# Patient Record
Sex: Male | Born: 1970 | Race: White | Hispanic: No | Marital: Married | Smoking: Never smoker
Health system: Southern US, Community
[De-identification: ages and names within clinical notes are randomized; demographics above are authoritative.]

## PROBLEM LIST (undated history)

## (undated) DIAGNOSIS — J45909 Unspecified asthma, uncomplicated: Secondary | ICD-10-CM

## (undated) DIAGNOSIS — F419 Anxiety disorder, unspecified: Secondary | ICD-10-CM

## (undated) DIAGNOSIS — T7840XA Allergy, unspecified, initial encounter: Secondary | ICD-10-CM

## (undated) HISTORY — DX: Allergy, unspecified, initial encounter: T78.40XA

## (undated) HISTORY — PX: CYST EXCISION: SHX5701

## (undated) HISTORY — DX: Unspecified asthma, uncomplicated: J45.909

---

## 2005-02-11 ENCOUNTER — Emergency Department (HOSPITAL_COMMUNITY): Admission: EM | Admit: 2005-02-11 | Discharge: 2005-02-11 | Payer: Self-pay | Admitting: Internal Medicine

## 2005-02-13 ENCOUNTER — Ambulatory Visit: Payer: Self-pay | Admitting: Internal Medicine

## 2005-02-21 ENCOUNTER — Ambulatory Visit: Payer: Self-pay | Admitting: Internal Medicine

## 2005-09-04 ENCOUNTER — Ambulatory Visit: Payer: Self-pay | Admitting: Internal Medicine

## 2009-01-18 ENCOUNTER — Emergency Department (HOSPITAL_COMMUNITY): Admission: EM | Admit: 2009-01-18 | Discharge: 2009-01-18 | Payer: Self-pay | Admitting: Emergency Medicine

## 2009-01-24 ENCOUNTER — Encounter: Admission: RE | Admit: 2009-01-24 | Discharge: 2009-01-24 | Payer: Self-pay | Admitting: Orthopaedic Surgery

## 2016-05-31 DIAGNOSIS — T63451D Toxic effect of venom of hornets, accidental (unintentional), subsequent encounter: Secondary | ICD-10-CM | POA: Diagnosis not present

## 2016-06-07 DIAGNOSIS — J018 Other acute sinusitis: Secondary | ICD-10-CM | POA: Diagnosis not present

## 2016-06-07 DIAGNOSIS — R05 Cough: Secondary | ICD-10-CM | POA: Diagnosis not present

## 2016-06-27 DIAGNOSIS — J40 Bronchitis, not specified as acute or chronic: Secondary | ICD-10-CM | POA: Diagnosis not present

## 2016-07-05 DIAGNOSIS — T63451D Toxic effect of venom of hornets, accidental (unintentional), subsequent encounter: Secondary | ICD-10-CM | POA: Diagnosis not present

## 2016-08-02 DIAGNOSIS — T63451D Toxic effect of venom of hornets, accidental (unintentional), subsequent encounter: Secondary | ICD-10-CM | POA: Diagnosis not present

## 2016-08-28 ENCOUNTER — Ambulatory Visit (INDEPENDENT_AMBULATORY_CARE_PROVIDER_SITE_OTHER): Payer: 59 | Admitting: Family Medicine

## 2016-08-28 ENCOUNTER — Encounter: Payer: Self-pay | Admitting: Family Medicine

## 2016-08-28 VITALS — BP 130/87 | HR 60 | Temp 98.0°F | Ht 71.0 in | Wt 277.8 lb

## 2016-08-28 DIAGNOSIS — Z0000 Encounter for general adult medical examination without abnormal findings: Secondary | ICD-10-CM

## 2016-08-28 NOTE — Progress Notes (Signed)
   HPI  Patient presents today here for an annual physical exam.  Patient states he had difficulty losing weight over the last few years. Otherwise he has no complaints.  He has been watching his diet a little bit better lately, he is not as active as he would like to be.  He previously played soccer with his son who has grownup some and has not been playing as much.  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  PMH: No significant medical history Past surgical history - none Family history: Mother with COPD, hypertension, MGF and PGF with heart disease Social history: Occasional alcohol use and works as an Chief Financial Officer ROS: Per HPI  Objective: BP 130/87   Pulse 60   Temp 98 F (36.7 C) (Oral)   Ht _0  (1.803 m)   Wt 277 lb 12.8 oz (126 kg)   BMI 38.75 kg/m  Gen: NAD, alert, cooperative with exam HEENT: NCAT, EOMI, PERRL, oropharynx clear, nares clear, TMs normal bilaterally CV: RRR, good S1/S2, no murmur Resp: CTABL, no wheezes, non-labored Abd: SNTND, BS present, no guarding or organomegaly Ext: No edema, warm Neuro: Alert and oriented, strength 5/5 in bilateral lower extremities  Assessment and plan:  # Annual physical exam Decided to wait normal exam Discussed therapeutic lifestyle changes. Patient is motivated. Basic labs fasting arranged. Follow-up one year unless needed center     Orders Placed This Encounter  Procedures  . CMP14+EGFR    Standing Status:   Future    Standing Expiration Date:   08/28/2017  . CBC with Differential/Platelet    Standing Status:   Future    Standing Expiration Date:   08/28/2017  . Lipid panel    Standing Status:   Future    Standing Expiration Date:   08/28/2017  . PSA    Standing Status:   Future    Standing Expiration Date:   08/28/2017  . TSH    Standing Status:   Future    Standing Expiration Date:   08/28/2017    Meds ordered this encounter  Medications  . mometasone (NASONEX) 50 MCG/ACT nasal spray   Sig: Place 2 sprays into the nose daily.  Marland Kitchen FLUTICASONE FUROATE IN    Sig: Inhale into the lungs.    Laroy Apple, MD [facility] 08/28/2016, 2:35 PM

## 2016-08-28 NOTE — Patient Instructions (Signed)
Great to meet you!  Lets have you come in for a lab appointment within 2 weeks for fasting labs.   We will plan on seeing you in 1 year unless you need [place] sooner.    Health Maintenance, Male A healthy lifestyle and preventive care is important for your health and wellness. Ask your health care provider about what schedule of regular examinations is right for you. What should I know about weight and diet?  Eat a Healthy Diet  Eat plenty of vegetables, fruits, whole grains, low-fat dairy products, and lean protein.  Do not eat a lot of foods high in solid fats, added sugars, or salt. Maintain a Healthy Weight  Regular exercise can help you achieve or maintain a healthy weight. You should:  Do at least 150 minutes of exercise each week. The exercise should increase your heart rate and make you sweat (moderate-intensity exercise).  Do strength-training exercises at least twice a week. Watch Your Levels of Cholesterol and Blood Lipids  Have your blood tested for lipids and cholesterol every 5 years starting at 46 years of age. If you are at high risk for heart disease, you should start having your blood tested when you are 46 years old. You may need to have your cholesterol levels checked more often if:  Your lipid or cholesterol levels are high.  You are older than 46 years of age.  You are at high risk for heart disease. What should I know about cancer screening? Many types of cancers can be detected early and may often be prevented. Lung Cancer  You should be screened every year for lung cancer if:  You are a current smoker who has smoked for at least 30 years.  You are a former smoker who has quit within the past 15 years.  Talk to your health care provider about your screening options, when you should start screening, and how often you should be screened. Colorectal Cancer  Routine colorectal cancer screening usually begins at 46 years of age and should be repeated every  5-10 years until you are 46 years old. You may need to be screened more often if early forms of precancerous polyps or small growths are found. Your health care provider may recommend screening at an earlier age if you have risk factors for colon cancer.  Your health care provider may recommend using home test kits to check for hidden blood in the stool.  A small camera at the end of a tube can be used to examine your colon (sigmoidoscopy or colonoscopy). This checks for the earliest forms of colorectal cancer. Prostate and Testicular Cancer  Depending on your age and overall health, your health care provider may do certain tests to screen for prostate and testicular cancer.  Talk to your health care provider about any symptoms or concerns you have about testicular or prostate cancer. Skin Cancer  Check your skin from head to toe regularly.  Tell your health care provider about any new moles or changes in moles, especially if:  There is a change in a mole's size, shape, or color.  You have a mole that is larger than a pencil eraser.  Always use sunscreen. Apply sunscreen liberally and repeat throughout the day.  Protect yourself by wearing long sleeves, pants, a wide-brimmed hat, and sunglasses when outside. What should I know about heart disease, diabetes, and high blood pressure?  If you are 53-84 years of age, have your blood pressure checked every 3-5 years. If  you are 26 years of age or older, have your blood pressure checked every year. You should have your blood pressure measured twice-once when you are at a hospital or clinic, and once when you are not at a hospital or clinic. Record the average of the two measurements. To check your blood pressure when you are not at a hospital or clinic, you can use:  An automated blood pressure machine at a pharmacy.  A home blood pressure monitor.  Talk to your health care provider about your target blood pressure.  If you are between  76-63 years old, ask your health care provider if you should take aspirin to prevent heart disease.  Have regular diabetes screenings by checking your fasting blood sugar level.  If you are at a normal weight and have a low risk for diabetes, have this test once every three years after the age of 60.  If you are overweight and have a high risk for diabetes, consider being tested at a younger age or more often.  A one-time screening for abdominal aortic aneurysm (AAA) by ultrasound is recommended for men aged 65-75 years who are current or former smokers. What should I know about preventing infection? Hepatitis B  If you have a higher risk for hepatitis B, you should be screened for this virus. Talk with your health care provider to find out if you are at risk for hepatitis B infection. Hepatitis C  Blood testing is recommended for:  Everyone born from [birth year] through 1965.  Anyone with known risk factors for hepatitis C. Sexually Transmitted Diseases (STDs)  You should be screened each year for STDs including gonorrhea and chlamydia if:  You are sexually active and are younger than 46 years of age.  You are older than 46 years of age and your health care provider tells you that you are at risk for this type of infection.  Your sexual activity has changed since you were last screened and you are at an increased risk for chlamydia or gonorrhea. Ask your health care provider if you are at risk.  Talk with your health care provider about whether you are at high risk of being infected with HIV. Your health care provider may recommend a prescription medicine to help prevent HIV infection. What else can I do?  Schedule regular health, dental, and eye exams.  Stay current with your vaccines (immunizations).  Do not use any tobacco products, such as cigarettes, chewing tobacco, and e-cigarettes. If you need help quitting, ask your health care provider.  Limit alcohol intake to no more than 2  drinks per day. One drink equals 12 ounces of beer, 5 ounces of wine, or 1 ounces of hard liquor.  Do not use street drugs.  Do not share needles.  Ask your health care provider for help if you need support or information about quitting drugs.  Tell your health care provider if you often feel depressed.  Tell your health care provider if you have ever been abused or do not feel safe at home. This information is not intended to replace advice given to you by your health care provider. Make sure you discuss any questions you have with your health care provider. Document Released: 11/11/2007 Document Revised: 01/12/2016 Document Reviewed: 02/16/2015 Elsevier Interactive Patient Education  2017 [employer].

## 2016-09-01 ENCOUNTER — Other Ambulatory Visit: Payer: 59

## 2016-09-01 DIAGNOSIS — Z0000 Encounter for general adult medical examination without abnormal findings: Secondary | ICD-10-CM | POA: Diagnosis not present

## 2016-09-01 DIAGNOSIS — T63451D Toxic effect of venom of hornets, accidental (unintentional), subsequent encounter: Secondary | ICD-10-CM | POA: Diagnosis not present

## 2016-09-02 LAB — PSA: PROSTATE SPECIFIC AG, SERUM: 0.3 ng/mL (ref 0.0–4.0)

## 2016-09-02 LAB — CBC WITH DIFFERENTIAL/PLATELET
BASOS: 0 %
Basophils Absolute: 0 10*3/uL (ref 0.0–0.2)
EOS (ABSOLUTE): 0.4 10*3/uL (ref 0.0–0.4)
Eos: 4 %
Hematocrit: 43.5 % (ref 37.5–51.0)
Hemoglobin: 14.4 g/dL (ref 13.0–17.7)
IMMATURE GRANULOCYTES: 0 %
Immature Grans (Abs): 0 10*3/uL (ref 0.0–0.1)
Lymphocytes Absolute: 2.6 10*3/uL (ref 0.7–3.1)
Lymphs: 25 %
MCH: 29.6 pg (ref 26.6–33.0)
MCHC: 33.1 g/dL (ref 31.5–35.7)
MCV: 89 fL (ref 79–97)
MONOS ABS: 0.9 10*3/uL (ref 0.1–0.9)
Monocytes: 8 %
NEUTROS PCT: 63 %
Neutrophils Absolute: 6.6 10*3/uL (ref 1.4–7.0)
PLATELETS: 236 10*3/uL (ref 150–379)
RBC: 4.87 x10E6/uL (ref 4.14–5.80)
RDW: 13.4 % (ref 12.3–15.4)
WBC: 10.5 10*3/uL (ref 3.4–10.8)

## 2016-09-02 LAB — CMP14+EGFR
A/G RATIO: 1.7 (ref 1.2–2.2)
ALK PHOS: 104 IU/L (ref 39–117)
ALT: 16 IU/L (ref 0–44)
AST: 12 IU/L (ref 0–40)
Albumin: 4.5 g/dL (ref 3.5–5.5)
BUN/Creatinine Ratio: 13 (ref 9–20)
BUN: 13 mg/dL (ref 6–24)
Bilirubin Total: 0.4 mg/dL (ref 0.0–1.2)
CO2: 25 mmol/L (ref 18–29)
Calcium: 9.6 mg/dL (ref 8.7–10.2)
Chloride: 101 mmol/L (ref 96–106)
Creatinine, Ser: 1.04 mg/dL (ref 0.76–1.27)
GFR calc Af Amer: 100 mL/min/{1.73_m2} (ref >59)
GFR calc non Af Amer: 86 mL/min/{1.73_m2} (ref >59)
GLOBULIN, TOTAL: 2.7 g/dL (ref 1.5–4.5)
Glucose: 81 mg/dL (ref 65–99)
POTASSIUM: 4.9 mmol/L (ref 3.5–5.2)
SODIUM: 144 mmol/L (ref 134–144)
Total Protein: 7.2 g/dL (ref 6.0–8.5)

## 2016-09-02 LAB — LIPID PANEL
Chol/HDL Ratio: 5.7 ratio — ABNORMAL HIGH (ref 0.0–5.0)
Cholesterol, Total: 193 mg/dL (ref 100–199)
HDL: 34 mg/dL — AB (ref >39)
LDL Calculated: 120 mg/dL — ABNORMAL HIGH (ref 0–99)
Triglycerides: 193 mg/dL — ABNORMAL HIGH (ref 0–149)
VLDL Cholesterol Cal: 39 mg/dL (ref 5–40)

## 2016-09-02 LAB — TSH: TSH: 2.53 u[IU]/mL (ref 0.450–4.500)

## 2016-09-27 DIAGNOSIS — T63441D Toxic effect of venom of bees, accidental (unintentional), subsequent encounter: Secondary | ICD-10-CM | POA: Diagnosis not present

## 2016-11-03 DIAGNOSIS — T63451D Toxic effect of venom of hornets, accidental (unintentional), subsequent encounter: Secondary | ICD-10-CM | POA: Diagnosis not present

## 2016-12-06 DIAGNOSIS — T63451D Toxic effect of venom of hornets, accidental (unintentional), subsequent encounter: Secondary | ICD-10-CM | POA: Diagnosis not present

## 2016-12-27 DIAGNOSIS — T63451D Toxic effect of venom of hornets, accidental (unintentional), subsequent encounter: Secondary | ICD-10-CM | POA: Diagnosis not present

## 2017-01-31 DIAGNOSIS — T63451D Toxic effect of venom of hornets, accidental (unintentional), subsequent encounter: Secondary | ICD-10-CM | POA: Diagnosis not present

## 2017-02-28 DIAGNOSIS — T63451D Toxic effect of venom of hornets, accidental (unintentional), subsequent encounter: Secondary | ICD-10-CM | POA: Diagnosis not present

## 2017-03-29 DIAGNOSIS — J4520 Mild intermittent asthma, uncomplicated: Secondary | ICD-10-CM | POA: Diagnosis not present

## 2017-03-29 DIAGNOSIS — T63451D Toxic effect of venom of hornets, accidental (unintentional), subsequent encounter: Secondary | ICD-10-CM | POA: Diagnosis not present

## 2017-03-29 DIAGNOSIS — T63441D Toxic effect of venom of bees, accidental (unintentional), subsequent encounter: Secondary | ICD-10-CM | POA: Diagnosis not present

## 2017-04-05 DIAGNOSIS — T63451D Toxic effect of venom of hornets, accidental (unintentional), subsequent encounter: Secondary | ICD-10-CM | POA: Diagnosis not present

## 2017-05-02 DIAGNOSIS — T63451D Toxic effect of venom of hornets, accidental (unintentional), subsequent encounter: Secondary | ICD-10-CM | POA: Diagnosis not present

## 2017-05-12 DIAGNOSIS — M25511 Pain in right shoulder: Secondary | ICD-10-CM | POA: Diagnosis not present

## 2017-05-12 DIAGNOSIS — G8929 Other chronic pain: Secondary | ICD-10-CM | POA: Diagnosis not present

## 2017-05-30 DIAGNOSIS — T63451D Toxic effect of venom of hornets, accidental (unintentional), subsequent encounter: Secondary | ICD-10-CM | POA: Diagnosis not present

## 2017-06-24 DIAGNOSIS — R05 Cough: Secondary | ICD-10-CM | POA: Diagnosis not present

## 2017-06-24 DIAGNOSIS — J329 Chronic sinusitis, unspecified: Secondary | ICD-10-CM | POA: Diagnosis not present

## 2017-07-04 DIAGNOSIS — T63451D Toxic effect of venom of hornets, accidental (unintentional), subsequent encounter: Secondary | ICD-10-CM | POA: Diagnosis not present

## 2017-08-07 DIAGNOSIS — T63451D Toxic effect of venom of hornets, accidental (unintentional), subsequent encounter: Secondary | ICD-10-CM | POA: Diagnosis not present

## 2017-08-30 DIAGNOSIS — T63451D Toxic effect of venom of hornets, accidental (unintentional), subsequent encounter: Secondary | ICD-10-CM | POA: Diagnosis not present

## 2017-09-27 DIAGNOSIS — T63451D Toxic effect of venom of hornets, accidental (unintentional), subsequent encounter: Secondary | ICD-10-CM | POA: Diagnosis not present

## 2017-09-27 DIAGNOSIS — J4520 Mild intermittent asthma, uncomplicated: Secondary | ICD-10-CM | POA: Diagnosis not present

## 2017-09-27 DIAGNOSIS — T63441D Toxic effect of venom of bees, accidental (unintentional), subsequent encounter: Secondary | ICD-10-CM | POA: Diagnosis not present

## 2017-10-31 DIAGNOSIS — J3089 Other allergic rhinitis: Secondary | ICD-10-CM | POA: Diagnosis not present

## 2017-11-14 DIAGNOSIS — M25511 Pain in right shoulder: Secondary | ICD-10-CM | POA: Insufficient documentation

## 2017-11-27 DIAGNOSIS — M25511 Pain in right shoulder: Secondary | ICD-10-CM | POA: Diagnosis not present

## 2017-12-03 DIAGNOSIS — M25511 Pain in right shoulder: Secondary | ICD-10-CM | POA: Diagnosis not present

## 2017-12-12 DIAGNOSIS — T63461D Toxic effect of venom of wasps, accidental (unintentional), subsequent encounter: Secondary | ICD-10-CM | POA: Diagnosis not present

## 2017-12-12 DIAGNOSIS — T63451D Toxic effect of venom of hornets, accidental (unintentional), subsequent encounter: Secondary | ICD-10-CM | POA: Diagnosis not present

## 2017-12-12 DIAGNOSIS — T63441D Toxic effect of venom of bees, accidental (unintentional), subsequent encounter: Secondary | ICD-10-CM | POA: Diagnosis not present

## 2018-02-08 DIAGNOSIS — M25511 Pain in right shoulder: Secondary | ICD-10-CM | POA: Diagnosis not present

## 2018-02-11 ENCOUNTER — Other Ambulatory Visit: Payer: Self-pay | Admitting: Orthopedic Surgery

## 2018-02-11 DIAGNOSIS — M25511 Pain in right shoulder: Secondary | ICD-10-CM

## 2018-02-19 ENCOUNTER — Ambulatory Visit
Admission: RE | Admit: 2018-02-19 | Discharge: 2018-02-19 | Disposition: A | Discharge status: Home / Self Care | Payer: 59 | Source: Ambulatory Visit | Attending: Orthopedic Surgery | Admitting: Orthopedic Surgery

## 2018-02-19 DIAGNOSIS — M19011 Primary osteoarthritis, right shoulder: Secondary | ICD-10-CM | POA: Diagnosis not present

## 2018-02-19 DIAGNOSIS — M25511 Pain in right shoulder: Secondary | ICD-10-CM

## 2018-03-06 DIAGNOSIS — M25511 Pain in right shoulder: Secondary | ICD-10-CM | POA: Diagnosis not present

## 2018-03-20 DIAGNOSIS — T63451D Toxic effect of venom of hornets, accidental (unintentional), subsequent encounter: Secondary | ICD-10-CM | POA: Diagnosis not present

## 2018-03-20 DIAGNOSIS — T63461D Toxic effect of venom of wasps, accidental (unintentional), subsequent encounter: Secondary | ICD-10-CM | POA: Diagnosis not present

## 2018-03-20 DIAGNOSIS — T63441D Toxic effect of venom of bees, accidental (unintentional), subsequent encounter: Secondary | ICD-10-CM | POA: Diagnosis not present

## 2018-03-20 DIAGNOSIS — Z23 Encounter for immunization: Secondary | ICD-10-CM | POA: Diagnosis not present

## 2018-03-27 DIAGNOSIS — T63451D Toxic effect of venom of hornets, accidental (unintentional), subsequent encounter: Secondary | ICD-10-CM | POA: Diagnosis not present

## 2018-04-04 DIAGNOSIS — T63451D Toxic effect of venom of hornets, accidental (unintentional), subsequent encounter: Secondary | ICD-10-CM | POA: Diagnosis not present

## 2018-04-10 DIAGNOSIS — T63451D Toxic effect of venom of hornets, accidental (unintentional), subsequent encounter: Secondary | ICD-10-CM | POA: Diagnosis not present

## 2018-04-10 DIAGNOSIS — J4530 Mild persistent asthma, uncomplicated: Secondary | ICD-10-CM | POA: Diagnosis not present

## 2018-04-10 DIAGNOSIS — T63441D Toxic effect of venom of bees, accidental (unintentional), subsequent encounter: Secondary | ICD-10-CM | POA: Diagnosis not present

## 2018-04-10 DIAGNOSIS — T63461D Toxic effect of venom of wasps, accidental (unintentional), subsequent encounter: Secondary | ICD-10-CM | POA: Diagnosis not present

## 2018-04-10 NOTE — Pre-Procedure Instructions (Signed)
Samuel Duran  04/10/2018      [id number] - [place], [place] - [address] [place] [id number] Phone: [phone] Fax: [phone]    Your procedure is scheduled on Nov. 21  [facility]:30  A.M.  Call this number if you have problems the morning of surgery:  [phone]   Remember:  Do not eat or drink after midnight.      Take these medicines the morning of surgery with A SIP OF WATER:              flonase nasal spray             levalbuterol (xopenex) inhaler--bring to hospital             Loratadine ( claritin)             Eye drops if needed            7 days prior to surgery STOP taking any Aspirin(unless otherwise instructed by your surgeon), Aleve, Naproxen, Ibuprofen, Motrin, Advil, Goody's, BC's, all herbal medications, fish oil, and all vitamins    Do not wear jewelry.  Do not wear lotions, powders, or perfumes, or deodorant.  Do not shave 48 hours prior to surgery.  Men may shave face and neck.  Do not bring valuables to the hospital.  [facility] is not responsible for any belongings or valuables.  Contacts, dentures or bridgework may not be worn into surgery.  Leave your suitcase in the car.  After surgery it may be brought to your room.  For patients admitted to the hospital, discharge time will be determined by your treatment team.  Patients discharged the day of surgery will not be allowed to drive home.    Special instructions:  [place]- Preparing For Surgery  Before surgery, you can play an important role. Because skin is not sterile, your skin needs to be as free of germs as possible. You can reduce the number of germs on your skin by washing with CHG (chlorahexidine gluconate) Soap before surgery.  CHG is an antiseptic cleaner which kills germs and bonds with the skin to continue killing germs even after washing.    Oral Hygiene is also important to reduce  your risk of infection.  Remember - BRUSH YOUR TEETH THE MORNING OF SURGERY WITH YOUR REGULAR TOOTHPASTE  Please do not use if you have an allergy to CHG or antibacterial soaps. If your skin becomes reddened/irritated stop using the CHG.  Do not shave (including legs and underarms) for at least 48 hours prior to first CHG shower. It is OK to shave your face.  Please follow these instructions carefully.   1. Shower the NIGHT BEFORE SURGERY and the MORNING OF SURGERY with CHG.   2. If you chose to wash your hair, wash your hair first as usual with your normal shampoo.  3. After you shampoo, rinse your hair and body thoroughly to remove the shampoo.  4. Use CHG as you would any other liquid soap. You can apply CHG directly to the skin and wash gently with a scrungie or a clean washcloth.   5. Apply the CHG Soap to your body ONLY FROM THE NECK DOWN.  Do not use on open wounds or open sores. Avoid contact with your eyes, ears, mouth and genitals (private parts). Wash Face and genitals (private parts)  with your normal soap.  6. Wash  thoroughly, paying special attention to the area where your surgery will be performed.  7. Thoroughly rinse your body with warm water from the neck down.  8. DO NOT shower/wash with your normal soap after using and rinsing off the CHG Soap.  9. Pat yourself dry with a CLEAN TOWEL.  10. Wear CLEAN PAJAMAS to bed the night before surgery, wear comfortable clothes the morning of surgery  11. Place CLEAN SHEETS on your bed the night of your first shower and DO NOT SLEEP WITH PETS.    Day of Surgery:  Do not apply any deodorants/lotions.  Please wear clean clothes to the hospital/surgery center.   Remember to brush your teeth WITH YOUR REGULAR TOOTHPASTE.    Please read over the following fact sheets that you were given. Coughing and Deep Breathing, MRSA Information and Surgical Site Infection Prevention

## 2018-04-11 ENCOUNTER — Encounter (HOSPITAL_COMMUNITY)
Admission: RE | Admit: 2018-04-11 | Discharge: 2018-04-11 | Disposition: A | Discharge status: Home / Self Care | Payer: 59 | Source: Ambulatory Visit | Attending: Orthopedic Surgery | Admitting: Orthopedic Surgery

## 2018-04-11 ENCOUNTER — Encounter (HOSPITAL_COMMUNITY): Payer: Self-pay

## 2018-04-11 DIAGNOSIS — Z01812 Encounter for preprocedural laboratory examination: Secondary | ICD-10-CM | POA: Diagnosis present

## 2018-04-11 HISTORY — DX: Anxiety disorder, unspecified: F41.9

## 2018-04-11 LAB — SURGICAL PCR SCREEN
MRSA, PCR: NEGATIVE
STAPHYLOCOCCUS AUREUS: NEGATIVE

## 2018-04-11 LAB — CBC
HEMATOCRIT: 43.1 % (ref 39.0–52.0)
HEMOGLOBIN: 13.7 g/dL (ref 13.0–17.0)
MCH: 28 pg (ref 26.0–34.0)
MCHC: 31.8 g/dL (ref 30.0–36.0)
MCV: 88.1 fL (ref 80.0–100.0)
Platelets: 359 10*3/uL (ref 150–400)
RBC: 4.89 MIL/uL (ref 4.22–5.81)
RDW: 13.3 % (ref 11.5–15.5)
WBC: 8.9 10*3/uL (ref 4.0–10.5)
nRBC: 0 % (ref 0.0–0.2)

## 2018-04-11 NOTE — Progress Notes (Signed)
PCP: Dr[facility] Ermalinda MemosBradshaw [facility]

## 2018-04-17 DIAGNOSIS — T63441D Toxic effect of venom of bees, accidental (unintentional), subsequent encounter: Secondary | ICD-10-CM | POA: Diagnosis not present

## 2018-04-17 DIAGNOSIS — T63451D Toxic effect of venom of hornets, accidental (unintentional), subsequent encounter: Secondary | ICD-10-CM | POA: Diagnosis not present

## 2018-04-17 DIAGNOSIS — T63461D Toxic effect of venom of wasps, accidental (unintentional), subsequent encounter: Secondary | ICD-10-CM | POA: Diagnosis not present

## 2018-04-17 MED ORDER — DEXTROSE 5 % IV SOLN
3.0000 g | INTRAVENOUS | Status: AC
  Administered 2018-04-18: 3 g via INTRAVENOUS
  Filled 2018-04-17: qty 3

## 2018-04-17 MED ORDER — TRANEXAMIC ACID-NACL 1000-0.7 MG/100ML-% IV SOLN
1000.0000 mg | INTRAVENOUS | Status: AC
  Administered 2018-04-18: 1000 mg via INTRAVENOUS
  Filled 2018-04-17 (×2): qty 100

## 2018-04-18 ENCOUNTER — Other Ambulatory Visit: Payer: Self-pay

## 2018-04-18 ENCOUNTER — Ambulatory Visit (HOSPITAL_COMMUNITY): Payer: 59 | Admitting: Anesthesiology

## 2018-04-18 ENCOUNTER — Encounter (HOSPITAL_COMMUNITY)
Admission: RE | Disposition: A | Discharge status: Home / Self Care | Payer: Self-pay | Source: Ambulatory Visit | Attending: Orthopedic Surgery

## 2018-04-18 ENCOUNTER — Encounter (HOSPITAL_COMMUNITY): Payer: Self-pay | Admitting: Surgery

## 2018-04-18 ENCOUNTER — Ambulatory Visit (HOSPITAL_COMMUNITY)
Admission: RE | Admit: 2018-04-18 | Discharge: 2018-04-18 | Disposition: A | Discharge status: Home / Self Care | Payer: 59 | Source: Ambulatory Visit | Attending: Orthopedic Surgery | Admitting: Orthopedic Surgery

## 2018-04-18 DIAGNOSIS — M25511 Pain in right shoulder: Secondary | ICD-10-CM | POA: Diagnosis not present

## 2018-04-18 DIAGNOSIS — M19011 Primary osteoarthritis, right shoulder: Secondary | ICD-10-CM | POA: Insufficient documentation

## 2018-04-18 DIAGNOSIS — R600 Localized edema: Secondary | ICD-10-CM | POA: Diagnosis not present

## 2018-04-18 DIAGNOSIS — G8918 Other acute postprocedural pain: Secondary | ICD-10-CM | POA: Diagnosis not present

## 2018-04-18 HISTORY — PX: TOTAL SHOULDER ARTHROPLASTY: SHX126

## 2018-04-18 SURGERY — ARTHROPLASTY, SHOULDER, TOTAL
Anesthesia: General | Laterality: Right

## 2018-04-18 MED ORDER — ONDANSETRON HCL 4 MG PO TABS
4.0000 mg | ORAL_TABLET | Freq: Once | ORAL | Status: DC
  Filled 2018-04-18 (×2): qty 1

## 2018-04-18 MED ORDER — EPHEDRINE SULFATE-NACL 50-0.9 MG/10ML-% IV SOSY
PREFILLED_SYRINGE | INTRAVENOUS | Status: DC | PRN
  Administered 2018-04-18 (×2): 5 mg via INTRAVENOUS

## 2018-04-18 MED ORDER — EPHEDRINE 5 MG/ML INJ
INTRAVENOUS | Status: AC
  Filled 2018-04-18: qty 10

## 2018-04-18 MED ORDER — BUPIVACAINE LIPOSOME 1.3 % IJ SUSP
INTRAMUSCULAR | Status: DC | PRN
  Administered 2018-04-18: 10 mL via PERINEURAL

## 2018-04-18 MED ORDER — DEXAMETHASONE SODIUM PHOSPHATE 10 MG/ML IJ SOLN
INTRAMUSCULAR | Status: AC
  Filled 2018-04-18: qty 1

## 2018-04-18 MED ORDER — SUGAMMADEX SODIUM 200 MG/2ML IV SOLN
INTRAVENOUS | Status: DC | PRN
  Administered 2018-04-18: 200 mg via INTRAVENOUS

## 2018-04-18 MED ORDER — OXYCODONE HCL 5 MG/5ML PO SOLN: 5.0000 mg | Freq: Once | ORAL | Status: DC | PRN

## 2018-04-18 MED ORDER — ROCURONIUM BROMIDE 50 MG/5ML IV SOSY
PREFILLED_SYRINGE | INTRAVENOUS | Status: DC | PRN
  Administered 2018-04-18: 50 mg via INTRAVENOUS

## 2018-04-18 MED ORDER — PROMETHAZINE HCL 25 MG/ML IJ SOLN: 6.2500 mg | INTRAMUSCULAR | Status: DC | PRN

## 2018-04-18 MED ORDER — SODIUM CHLORIDE 0.9 % IV SOLN
INTRAVENOUS | Status: DC | PRN
  Administered 2018-04-18: 20 ug/min via INTRAVENOUS

## 2018-04-18 MED ORDER — OXYCODONE HCL 5 MG PO TABS: 5.0000 mg | ORAL_TABLET | Freq: Once | ORAL | Status: DC | PRN

## 2018-04-18 MED ORDER — LACTATED RINGERS IV SOLN
INTRAVENOUS | Status: DC | PRN
  Administered 2018-04-18: 07:00:00 via INTRAVENOUS

## 2018-04-18 MED ORDER — 0.9 % SODIUM CHLORIDE (POUR BTL) OPTIME
TOPICAL | Status: DC | PRN
  Administered 2018-04-18: 1000 mL

## 2018-04-18 MED ORDER — LIDOCAINE 2% (20 MG/ML) 5 ML SYRINGE
INTRAMUSCULAR | Status: AC
  Filled 2018-04-18: qty 5

## 2018-04-18 MED ORDER — HYDROMORPHONE HCL 1 MG/ML IJ SOLN: 0.2500 mg | INTRAMUSCULAR | Status: DC | PRN

## 2018-04-18 MED ORDER — MEPERIDINE HCL 50 MG/ML IJ SOLN: 6.2500 mg | INTRAMUSCULAR | Status: DC | PRN

## 2018-04-18 MED ORDER — NAPROXEN 500 MG PO TABS: 500.0000 mg | ORAL_TABLET | Freq: Two times a day (BID) | ORAL | 1 refills | Status: DC

## 2018-04-18 MED ORDER — PROPOFOL 10 MG/ML IV BOLUS
INTRAVENOUS | Status: DC | PRN
  Administered 2018-04-18: 200 mg via INTRAVENOUS

## 2018-04-18 MED ORDER — PROPOFOL 10 MG/ML IV BOLUS
INTRAVENOUS | Status: AC
  Filled 2018-04-18: qty 20

## 2018-04-18 MED ORDER — LIDOCAINE 2% (20 MG/ML) 5 ML SYRINGE
INTRAMUSCULAR | Status: DC | PRN
  Administered 2018-04-18: 60 mg via INTRAVENOUS

## 2018-04-18 MED ORDER — OXYCODONE-ACETAMINOPHEN 5-325 MG PO TABS: 1.0000 | ORAL_TABLET | ORAL | 0 refills | Status: DC | PRN

## 2018-04-18 MED ORDER — ONDANSETRON HCL 4 MG/2ML IJ SOLN
INTRAMUSCULAR | Status: AC
  Filled 2018-04-18: qty 2

## 2018-04-18 MED ORDER — SUGAMMADEX SODIUM 200 MG/2ML IV SOLN
INTRAVENOUS | Status: AC
  Filled 2018-04-18: qty 2

## 2018-04-18 MED ORDER — CHLORHEXIDINE GLUCONATE 4 % EX LIQD: 60.0000 mL | Freq: Once | CUTANEOUS | Status: DC

## 2018-04-18 MED ORDER — CYCLOBENZAPRINE HCL 10 MG PO TABS: 10.0000 mg | ORAL_TABLET | Freq: Three times a day (TID) | ORAL | 1 refills | Status: DC | PRN

## 2018-04-18 MED ORDER — ROCURONIUM BROMIDE 50 MG/5ML IV SOSY
PREFILLED_SYRINGE | INTRAVENOUS | Status: AC
  Filled 2018-04-18: qty 5

## 2018-04-18 MED ORDER — FENTANYL CITRATE (PF) 250 MCG/5ML IJ SOLN
INTRAMUSCULAR | Status: AC
  Filled 2018-04-18: qty 5

## 2018-04-18 MED ORDER — ONDANSETRON HCL 4 MG PO TABS: 4.0000 mg | ORAL_TABLET | Freq: Three times a day (TID) | ORAL | 0 refills | Status: DC | PRN

## 2018-04-18 MED ORDER — BUPIVACAINE HCL (PF) 0.5 % IJ SOLN
INTRAMUSCULAR | Status: DC | PRN
  Administered 2018-04-18: 20 mL via PERINEURAL

## 2018-04-18 MED ORDER — ONDANSETRON HCL 4 MG/2ML IJ SOLN
INTRAMUSCULAR | Status: DC | PRN
  Administered 2018-04-18: 4 mg via INTRAVENOUS

## 2018-04-18 MED ORDER — FENTANYL CITRATE (PF) 100 MCG/2ML IJ SOLN
INTRAMUSCULAR | Status: DC | PRN
  Administered 2018-04-18 (×2): 50 ug via INTRAVENOUS

## 2018-04-18 MED ORDER — MIDAZOLAM HCL 5 MG/5ML IJ SOLN
INTRAMUSCULAR | Status: DC | PRN
  Administered 2018-04-18: 2 mg via INTRAVENOUS

## 2018-04-18 MED ORDER — MIDAZOLAM HCL 2 MG/2ML IJ SOLN
INTRAMUSCULAR | Status: AC
  Filled 2018-04-18: qty 2

## 2018-04-18 SURGICAL SUPPLY — 69 items
ADH SKN CLS APL DERMABOND .7 (GAUZE/BANDAGES/DRESSINGS) ×1
ADH SKN CLS LQ APL DERMABOND (GAUZE/BANDAGES/DRESSINGS) ×1
AID PSTN UNV HD RSTRNT DISP (MISCELLANEOUS) ×1
BIT DRILL 5/64X5 DISP (BIT) ×3 IMPLANT
BLADE SAW SGTL 83.5X18.5 (BLADE) ×3 IMPLANT
CALIBRATOR GLENOID VIP 5-D (SYSTAGENIX WOUND MANAGEMENT) ×2 IMPLANT
CEMENT BONE DEPUY (Cement) ×3 IMPLANT
COVER SURGICAL LIGHT HANDLE (MISCELLANEOUS) ×3 IMPLANT
COVER WAND RF STERILE (DRAPES) ×3 IMPLANT
DERMABOND ADHESIVE PROPEN (GAUZE/BANDAGES/DRESSINGS) ×2
DERMABOND ADVANCED (GAUZE/BANDAGES/DRESSINGS) ×2
DERMABOND ADVANCED .7 DNX12 (GAUZE/BANDAGES/DRESSINGS) IMPLANT
DERMABOND ADVANCED .7 DNX6 (GAUZE/BANDAGES/DRESSINGS) ×1 IMPLANT
DRAPE ORTHO SPLIT 77X108 STRL (DRAPES) ×6
DRAPE SURG 17X11 SM STRL (DRAPES) ×3 IMPLANT
DRAPE SURG ORHT 6 SPLT 77X108 (DRAPES) ×2 IMPLANT
DRAPE U-SHAPE 47X51 STRL (DRAPES) ×3 IMPLANT
DRSG AQUACEL AG ADV 3.5X10 (GAUZE/BANDAGES/DRESSINGS) ×3 IMPLANT
DURAPREP 26ML APPLICATOR (WOUND CARE) ×3 IMPLANT
ELECT BLADE 4.0 EZ CLEAN MEGAD (MISCELLANEOUS) ×3
ELECT CAUTERY BLADE 6.4 (BLADE) ×3 IMPLANT
ELECT REM PT RETURN 9FT ADLT (ELECTROSURGICAL) ×3
ELECTRODE BLDE 4.0 EZ CLN MEGD (MISCELLANEOUS) ×1 IMPLANT
ELECTRODE REM PT RTRN 9FT ADLT (ELECTROSURGICAL) ×1 IMPLANT
FACESHIELD WRAPAROUND (MASK) ×9 IMPLANT
FACESHIELD WRAPAROUND OR TEAM (MASK) ×3 IMPLANT
GLENOID WITH CLEAT MEDIUM (Shoulder) ×2 IMPLANT
GLOVE BIO SURGEON STRL SZ7.5 (GLOVE) ×3 IMPLANT
GLOVE BIO SURGEON STRL SZ8 (GLOVE) ×3 IMPLANT
GLOVE EUDERMIC 7 POWDERFREE (GLOVE) ×3 IMPLANT
GLOVE SS BIOGEL STRL SZ 7.5 (GLOVE) ×1 IMPLANT
GLOVE SUPERSENSE BIOGEL SZ 7.5 (GLOVE) ×2
GOWN STRL REUS W/ TWL LRG LVL3 (GOWN DISPOSABLE) ×1 IMPLANT
GOWN STRL REUS W/ TWL XL LVL3 (GOWN DISPOSABLE) ×2 IMPLANT
GOWN STRL REUS W/TWL LRG LVL3 (GOWN DISPOSABLE) ×3
GOWN STRL REUS W/TWL XL LVL3 (GOWN DISPOSABLE) ×6
HEAD HUMERAL USP II 50/21 (Shoulder) ×2 IMPLANT
KIT BASIN OR (CUSTOM PROCEDURE TRAY) ×3 IMPLANT
KIT SET UNIVERSAL (KITS) ×2 IMPLANT
KIT TURNOVER KIT B (KITS) ×3 IMPLANT
MANIFOLD NEPTUNE II (INSTRUMENTS) ×3 IMPLANT
NDL TAPERED W/ NITINOL LOOP (MISCELLANEOUS) ×1 IMPLANT
NEEDLE TAPERED W/ NITINOL LOOP (MISCELLANEOUS) ×3 IMPLANT
NS IRRIG 1000ML POUR BTL (IV SOLUTION) ×3 IMPLANT
PACK SHOULDER (CUSTOM PROCEDURE TRAY) ×3 IMPLANT
PAD ARMBOARD 7.5X6 YLW CONV (MISCELLANEOUS) ×6 IMPLANT
RESTRAINT HEAD UNIVERSAL NS (MISCELLANEOUS) ×3 IMPLANT
SLING ARM FOAM STRAP LRG (SOFTGOODS) IMPLANT
SLING ARM IMMOBILIZER LRG (SOFTGOODS) IMPLANT
SLING ARM IMMOBILIZER MED (SOFTGOODS) IMPLANT
SLING ARM XL FOAM STRAP (SOFTGOODS) ×3 IMPLANT
SMARTMIX MINI TOWER (MISCELLANEOUS) ×3
SPONGE LAP 18X18 X RAY DECT (DISPOSABLE) ×3 IMPLANT
SPONGE LAP 4X18 RFD (DISPOSABLE) ×3 IMPLANT
STEM HUMERAL APEX UNI 9MM (Stem) ×2 IMPLANT
SUCTION FRAZIER HANDLE 10FR (MISCELLANEOUS) ×2
SUCTION TUBE FRAZIER 10FR DISP (MISCELLANEOUS) ×1 IMPLANT
SUT FIBERWIRE #2 38 T-5 BLUE (SUTURE) ×3
SUT MNCRL AB 3-0 PS2 18 (SUTURE) ×3 IMPLANT
SUT MON AB 2-0 CT1 36 (SUTURE) ×3 IMPLANT
SUT VIC AB 1 CT1 27 (SUTURE) ×9
SUT VIC AB 1 CT1 27XBRD ANBCTR (SUTURE) ×3 IMPLANT
SUTURE FIBERWR #2 38 T-5 BLUE (SUTURE) ×1 IMPLANT
SUTURE TAPE 1.3 40 TPR END (SUTURE) ×3 IMPLANT
SUTURETAPE 1.3 40 TPR END (SUTURE) ×9
SYR CONTROL 10ML LL (SYRINGE) IMPLANT
TOWEL OR 17X26 10 PK STRL BLUE (TOWEL DISPOSABLE) ×3 IMPLANT
TOWER SMARTMIX MINI (MISCELLANEOUS) ×1 IMPLANT
WATER STERILE IRR 1000ML POUR (IV SOLUTION) ×3 IMPLANT

## 2018-04-18 NOTE — Op Note (Signed)
04/18/2018  10:18 AM  PATIENT:   Samuel Duran  47 y.o. male  PRE-OPERATIVE DIAGNOSIS:  right shoulder osteoarthritis  POST-OPERATIVE DIAGNOSIS: Same  PROCEDURE: Right total shoulder arthroplasty utilizing a press-fit size 9 Arthrex stem with a 50 x 21 eccentric head, and a medium glenoid  SURGEON:  Olman Yono, Vania Rea M.D.  ASSISTANTS: Ralene Bathe, PA-C  ANESTHESIA:   General endotracheal with interscalene block using Exparel  EBL: 250 cc  SPECIMEN: None  Drains: None   PATIENT DISPOSITION:  PACU - hemodynamically stable.    PLAN OF CARE: Admit for overnight observation  Brief history:  Mr. Swiss is a 47 year old gentleman who has had chronic and progressively increasing right shoulder pain related to advanced osteoarthritis with underlying glenoid dysplasia.  His symptoms have been progressive to the point where he is having severe pain and functional limitations and inability to perform a number of day-to-day activities.  His plain radiographs confirm joint deformity with changes consistent with glenoid dysplasia.  A preoperative CT scan was obtained for preoperative planning and did identify appropriate bone stock for an anatomic shoulder replacement.  He is now brought to the operating room for planned anatomic total shoulder arthroplasty  Preoperatively and counseled Mr. Angelica regarding treatment options as well as the potential risks versus benefits thereof.  Possible surgical complications were reviewed including potential bleeding, infection, neurovascular injury, persistent pain, loss of motion, failure the implant, anesthetic complication, and possible need for additional surgery.  He understands and accepts and agrees with her planned procedure.  Procedure detail:  After undergoing routine preop evaluation patient received prophylactic antibiotics and interscalene block with Exparel was established in the holding area by the anesthesia department.  Placed  supine on the operative table underwent smooth induction of a general endotracheal anesthesia.  Placed in the beachchair position and appropriate padding protected.  Right shoulder girdle region was sterilely prepped and draped in standard fashion.  Timeout was called.  Anterior deltopectoral approach to the right shoulder was made through 10 cm incision.  Skin flaps were elevated dissection carried deeply and electrocautery was used for hemostasis.  The deltopectoral interval was then developed from proximal to distal with a vein taken laterally in the upper centimeter and half of the pectoralis major was tenotomized and the chondroitin was mobilized and retracted medially.  The long head biceps tendon was then unroofed tenodesed at the upper border the pectoralis major tendon tenotomized and then unroofed and excised proximally.  We then used electrocautery to outline the margins of the subscapularis insertion into the lesser tuberosity and split the rotator cuff along the rotator interval towards the base of the coracoid and once we identify the dimensions of the lesser tuberosity using oscillating saw to create a lesser tuberosity osteotomy and then mobilized subscapularis and reflected medially and then divided the capsular attachments from the anterior inferior and inferior margins of the humeral neck allowing deliver the humeral head to the wound.  We outlined our proposed humeral head resection with an extra medullary guide and then the head was excised using leg and oscillating saw.  The marginal ossified were then removed with rondure.  The humeral canal was then prepared hand reaming size 7 broaching to size 9 with excellent fit maintaining the native retroversion of approximate 20 degrees.  Size 8 trial stem with metal cap placed in the humeral canal then turned attention to the glenoid.  This was exposed with standard retractors and performed a circumferential labral resection gaining complete  visualization the periphery of the glenoid.  This did show significant dysplasia with preoperative CT scan demonstrating 30 degrees of native retroversion.  Using iris preoperative CT planning we had calibrated our glenoid guide for a 10 degree correction.  The appropriate guide was then placed in a guidepin was directed to the glenoid and then was then reamed with a medium reamer to a stable subchondral bony bed allowing excellent seating of our implant.  The glenoid was then terminally prepared with the central followed by superior and inferior drill and peg respectively implant was then broached in the trial showed excellent fit and good bony support.  At this point on the back table our cement was mixed and introduced into the superior and inferior peg and slot respectively then we took morselized bone and packed this around the central peg of her glenoid the glenoid was then impacted with excellent fit and fixation.  At this time we then returned our attention to the proximal humerus and one medial and to lateral drill holes were then placed through the humeral metaphysis adjacent to the lesser tuberosity osteotomy for repair of the LTO.  Our humeral stem was then threaded with the sutures for our LTL repair the sutures were then shuttled through the bone tunnels on the metaphysis of the humerus the humeral stem was then seated to the proper level with excellent fit and the proximal locking screws were then tightened.  This point we then performed a series of trial reductions and ultimately felt that the size 50 x 21 eccentric head gave us the best soft tissue balance with good stability and easily 50% translation of the humeral head and glenoid.  This point the [facility] taper on the stem was cleaned and dried the final 50 x 21 head was then impacted and the final reduction was performed at the joint was irrigated cleaned and dried.  This point we threaded our medial sutures up through the bone tendon injection of  left tuberosity osteotomy the bone fragment was then manually held in place and the suture limbs were then tied with both parallel and crossing suture limbs all of which allowed excellent re-apposition of the LTO to the humeral metaphysis.  I should mention that we confirmed good mobility of the subscapularis prior to the [facility] repair.  This point the rotator interval was then repaired with a series of nonabsorbable sutures reinforcing the LTL repair.  Once we were completed we are very pleased with the overall soft tissue balance and shoulder easily achieved 30 degrees of X rotation without excessive tension on the LTO.  The wound was then again irrigated hemostasis was obtained.  The deltopectoral interval was reapproximated with a series of figure-of-eight and 1 Vicryl sutures.  2-0 Vicryl used with subcu layer intracuticular 3 Monocryl for the skin followed by Dermabond and Aquasol dressing and then right arm was placed in a sling.  Patient was awakened extubated taken recovery in stable condition  Ralene Batheracy Shuford, PA-C was used as an Geophysicist/field seismologistassistant throughout this case essential for help with positioning the patient position extremity, tissue ablation, implantation of the prosthesis, wound closure, and intraoperative decision-making.  Senaida LangeKevin M Bodi Palmeri MD   Contact # [phone]

## 2018-04-18 NOTE — Anesthesia Postprocedure Evaluation (Signed)
Anesthesia Post Note  Patient: Samuel GlennChristopher R Schloss  Procedure(s) Performed: TOTAL SHOULDER ARTHROPLASTY (Right )     Patient location during evaluation: PACU Anesthesia Type: General Level of consciousness: awake and alert Pain management: pain level controlled Vital Signs Assessment: post-procedure vital signs reviewed and stable Respiratory status: spontaneous breathing, nonlabored ventilation and respiratory function stable Cardiovascular status: blood pressure returned to baseline and stable Postop Assessment: no apparent nausea or vomiting Anesthetic complications: no    Last Vitals:  Vitals:   04/18/18 1105 04/18/18 1120  BP: 117/75 118/80  Pulse: 85 79  Resp: 16 18  Temp:    SpO2: 95% 97%    Last Pain:  Vitals:   04/18/18 1120  PainSc: 0-No pain                 Lowella CurbWarren Ray Joshua Zeringue

## 2018-04-18 NOTE — Discharge Instructions (Signed)
° °Kevin M. Supple, M.D., F.A.A.O.S. °Orthopaedic Surgery °Specializing in Arthroscopic and Reconstructive °Surgery of the Shoulder and Knee °[phone] °[address] - [place], [place] [address] - Fax [phone] ° ° °POST-OP TOTAL SHOULDER REPLACEMENT INSTRUCTIONS ° °1. Call the office at [phone] to schedule your first post-op appointment 10-14 days from the date of your surgery. ° °2. The bandage over your incision is waterproof. You may begin showering with this dressing on. You may leave this dressing on until first follow up appointment within 2 weeks. We prefer you leave this dressing in place until follow up however after 5-7 days if you are having itching or skin irritation and would like to remove it you may do so. Go slow and tug at the borders gently to break the bond the dressing has with the skin. At this point if there is no drainage it is okay to go without a bandage or you may cover it with a light guaze and tape. You can also expect significant bruising around your shoulder that will drift down your arm and into your chest wall. This is very normal and should resolve over several days. ° ° 3. Wear your sling/immobilizer at all times except to perform the exercises below or to occasionally let your arm dangle by your side to stretch your elbow. You also need to sleep in your sling immobilizer until instructed otherwise. ° °4. Range of motion to your elbow, wrist, and hand are encouraged 3-5 times daily. Exercise to your hand and fingers helps to reduce swelling you may experience. ° °5. Utilize ice to the shoulder 3-5 times minimum a day and additionally if you are experiencing pain. ° °6. Prescriptions for a pain medication and a muscle relaxant are provided for you. It is recommended that if you are experiencing pain that you pain medication alone is not controlling, add the muscle relaxant along with the pain medication which can give additional pain relief. The first 1-2 days  is generally the most severe of your pain and then should gradually decrease. As your pain lessens it is recommended that you decrease your use of the pain medications to an "as needed basis'" only and to always comply with the recommended dosages of the pain medications. ° °7. Pain medications can produce constipation along with their use. If you experience this, the use of an over the counter stool softener or laxative daily is recommended.  ° °8. For additional questions or concerns, please do not hesitate to call the office. If after hours there is an answering service to forward your concerns to the physician on call. ° °9.Pain control following an exparel block ° °To help control your post-operative pain you received a nerve block  performed with Exparel which is a long acting anesthetic (numbing agent) which can provide pain relief and sensations of numbness (and relief of pain) in the operative shoulder and arm for up to 3 days. Sometimes it provides mixed relief, meaning you may still have numbness in certain areas of the arm but can still be able to move  parts of that arm, hand, and fingers. We recommend that your prescribed pain medications  be used as needed. We do not feel it is necessary to "pre medicate" and "stay ahead" of pain.  Taking narcotic pain medications when you are not having any pain can lead to unnecessary and potentially dangerous side effects.  ° °POST-OP EXERCISES ° °Pendulum Exercises ° °Perform pendulum exercises while standing and bending at   the waist. Support your uninvolved arm on a table or chair and allow your operated arm to hang freely. Make sure to do these exercises passively - not using you shoulder muscles. ° °Repeat 20 times. Do 3 sessions per day. ° ° ° ° °

## 2018-04-18 NOTE — Anesthesia Procedure Notes (Signed)
Anesthesia Regional Block: Interscalene brachial plexus block   Pre-Anesthetic Checklist: ,, timeout performed, Correct Patient, Correct Site, Correct Laterality, Correct Procedure, Correct Position, site marked, Risks and benefits discussed,  Surgical consent,  Pre-op evaluation,  At surgeon's request and post-op pain management  Laterality: Right  Prep: chloraprep       Needles:  Injection technique: Single-shot  Needle Type: Stimiplex     Needle Length: 9cm  Needle Gauge: 21     Additional Needles:   Procedures:,,,, ultrasound used (permanent image in chart),,,,  Narrative:  Start time: 04/18/2018 7:17 AM End time: 04/18/2018 7:22 AM Injection made incrementally with aspirations every 5 mL.  Performed by: Personally  Anesthesiologist: Lowella CurbMiller, Antonino Nienhuis Ray, MD

## 2018-04-18 NOTE — Transfer of Care (Signed)
Immediate Anesthesia Transfer of Care Note  Patient: Samuel Duran  Procedure(s) Performed: TOTAL SHOULDER ARTHROPLASTY (Right )  Patient Location: PACU  Anesthesia Type:GA combined with regional for post-op pain  Level of Consciousness: awake, alert  and oriented  Airway & Oxygen Therapy: Patient Spontanous Breathing and Patient connected to nasal cannula oxygen  Post-op Assessment: Report given to RN and Post -op Vital signs reviewed and stable  Post vital signs: Reviewed and stable  Last Vitals:  Vitals Value Taken Time  BP 117/75 04/18/2018 11:05 AM  Temp    Pulse 86 04/18/2018 11:05 AM  Resp 15 04/18/2018 11:05 AM  SpO2 94 % 04/18/2018 11:05 AM  Vitals shown include unvalidated device data.  Last Pain:  Vitals:   04/18/18 1050  PainSc: 0-No pain      Patients Stated Pain Goal: 4 (04/18/18 0606)  Complications: No apparent anesthesia complications

## 2018-04-18 NOTE — H&P (Signed)
Samuel Duran    Chief Complaint: right shoulder osteoarthritis HPI: The patient is a 47 y.o. male with end stage right shoulder OA  Past Medical History:  Diagnosis Date  . Allergy   . Anxiety   . Asthma    allergy induced asthma    Past Surgical History:  Procedure Laterality Date  . CYST EXCISION     forehead    Family History  Problem Relation Age of Onset  . COPD Mother   . Hypertension Father   . Heart disease Maternal Grandfather   . Heart disease Paternal Grandfather     Social History:  reports that he has never smoked. He has never used smokeless tobacco. He reports that he drinks alcohol. He reports that he does not use drugs.   Medications Prior to Admission  Medication Sig Dispense Refill  . EPINEPHrine (AUVI-Q) 0.3 mg/0.3 mL IJ SOAJ injection Inject 0.3 mg into the muscle once.    . fluticasone (FLONASE) 50 MCG/ACT nasal spray Place 2 sprays into both nostrils daily.    Marland Kitchen. ibuprofen (ADVIL,MOTRIN) 200 MG tablet Take 600-800 mg by mouth every 6 (six) hours as needed for headache or moderate pain.    Marland Kitchen. levalbuterol (XOPENEX HFA) 45 MCG/ACT inhaler Inhale 1 puff into the lungs every 6 (six) hours as needed for wheezing or shortness of breath.    . loratadine (CLARITIN) 10 MG tablet Take 10 mg by mouth daily.    . Multiple Vitamins-Minerals (MULTIVITAMIN PO) Take 1 tablet by mouth daily.    Marland Kitchen. olopatadine (PATANOL) 0.1 % ophthalmic solution Place 1 drop into both eyes 2 (two) times daily as needed for allergies.    Marland Kitchen. QVAR REDIHALER 40 MCG/ACT inhaler Inhale 1 puff into the lungs 2 (two) times daily.       Physical Exam: right shoulder with painful and restricted motion as noted at recent office visits  Vitals  Temp:  [98.3 F (36.8 C)] 98.3 F (36.8 C) (11/21 0546) Pulse Rate:  [65] 65 (11/21 0546) Resp:  [20] 20 (11/21 0546) BP: (132)/(72) 132/72 (11/21 0546) SpO2:  [99 %] 99 % (11/21 0546)  Assessment/Plan  Impression: right shoulder  osteoarthritis  Plan of Action: Procedure(s): TOTAL SHOULDER ARTHROPLASTY  Samuel Duran 04/18/2018, 6:00 AM Contact # [phone]

## 2018-04-18 NOTE — Anesthesia Preprocedure Evaluation (Signed)
Anesthesia Evaluation  Patient identified by MRN, date of birth, ID band Patient awake    Reviewed: Allergy & Precautions, NPO status , Patient's Chart, lab work & pertinent test results  Airway Mallampati: II  TM Distance: >3 FB Neck ROM: Full    Dental no notable dental hx.    Pulmonary asthma ,    Pulmonary exam normal breath sounds clear to auscultation       Cardiovascular negative cardio ROS Normal cardiovascular exam Rhythm:Regular Rate:Normal     Neuro/Psych Anxiety negative neurological ROS  negative psych ROS   GI/Hepatic negative GI ROS, Neg liver ROS,   Endo/Other  negative endocrine ROS  Renal/GU negative Renal ROS  negative genitourinary   Musculoskeletal negative musculoskeletal ROS (+)   Abdominal (+) + obese,   Peds negative pediatric ROS (+)  Hematology negative hematology ROS (+)   Anesthesia Other Findings   Reproductive/Obstetrics negative OB ROS                             Anesthesia Physical Anesthesia Plan  ASA: II  Anesthesia Plan: General   Post-op Pain Management:  Regional for Post-op pain   Induction: Intravenous  PONV Risk Score and Plan: 2 and Ondansetron and Midazolam  Airway Management Planned: Oral ETT  Additional Equipment:   Intra-op Plan:   Post-operative Plan: Extubation in OR  Informed Consent: I have reviewed the patients History and Physical, chart, labs and discussed the procedure including the risks, benefits and alternatives for the proposed anesthesia with the patient or authorized representative who has indicated his/her understanding and acceptance.   Dental advisory given  Plan Discussed with: CRNA  Anesthesia Plan Comments:         Anesthesia Quick Evaluation

## 2018-04-18 NOTE — Anesthesia Procedure Notes (Signed)
Procedure Name: Intubation Date/Time: 04/18/2018 7:46 AM Performed by: Kyung Rudd, CRNA Pre-anesthesia Checklist: Patient identified, Emergency Drugs available, Suction available and Patient being monitored Patient Re-evaluated:Patient Re-evaluated prior to induction Oxygen Delivery Method: Circle system utilized Preoxygenation: Pre-oxygenation with 100% oxygen Induction Type: IV induction Ventilation: Mask ventilation without difficulty and Oral airway inserted - appropriate to patient size Laryngoscope Size: Mac and 4 Grade View: Grade I Tube type: Oral Tube size: 7.5 mm Number of attempts: 1 Airway Equipment and Method: Stylet Placement Confirmation: ETT inserted through vocal cords under direct vision,  positive ETCO2 and breath sounds checked- equal and bilateral Secured at: 22 cm Tube secured with: Tape Dental Injury: Teeth and Oropharynx as per pre-operative assessment

## 2018-04-19 ENCOUNTER — Encounter (HOSPITAL_COMMUNITY): Payer: Self-pay | Admitting: Orthopedic Surgery

## 2018-05-01 DIAGNOSIS — Z96611 Presence of right artificial shoulder joint: Secondary | ICD-10-CM | POA: Diagnosis not present

## 2018-05-01 DIAGNOSIS — Z471 Aftercare following joint replacement surgery: Secondary | ICD-10-CM | POA: Diagnosis not present

## 2018-05-06 ENCOUNTER — Ambulatory Visit: Payer: 59 | Attending: Orthopedic Surgery | Admitting: Physical Therapy

## 2018-05-06 DIAGNOSIS — M25511 Pain in right shoulder: Secondary | ICD-10-CM | POA: Diagnosis not present

## 2018-05-06 DIAGNOSIS — M25611 Stiffness of right shoulder, not elsewhere classified: Secondary | ICD-10-CM | POA: Insufficient documentation

## 2018-05-06 DIAGNOSIS — G8929 Other chronic pain: Secondary | ICD-10-CM | POA: Insufficient documentation

## 2018-05-06 NOTE — Therapy (Signed)
[facility] [address] [place] [address] [place], [place], [address] Phone: [phone]   Fax:  [phone]  Physical Therapy Evaluation  Patient Details  Name: Samuel Duran MRN: [id number] Date of Birth: [birth year] Referring Provider (PT): Francena Hanly MD   Encounter Date: 05/06/2018  PT End of Session - 05/06/18 1427    Visit Number  1    Number of Visits  16    Date for PT Re-Evaluation  07/01/18    PT Start Time  0145    PT Stop Time  0222    PT Time Calculation (min)  37 min    Activity Tolerance  Patient tolerated treatment well    Behavior During Therapy  [facility] for tasks assessed/performed       Past Medical History:  Diagnosis Date  . Allergy   . Anxiety   . Asthma    allergy induced asthma    Past Surgical History:  Procedure Laterality Date  . CYST EXCISION     forehead  . 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   Subjective Assessment - 05/06/18 1429    Subjective  The patient presents to the clinic today s/p right total shoulder replacement performed on 04/18/18.  He has been doing the pendulum exercise at home.  His pain is a 2/10 at rest and a 5/10 out of the sling.      Pertinent History  Dysplasia of shoulder    Patient Stated Goals  Use arm without pain.    Currently in Pain?  Yes    Pain Score  2     Pain Location  Shoulder    Pain Orientation  Right    Pain Descriptors / Indicators  Dull;Discomfort;Aching    Pain Type  Surgical pain    Pain Onset  1 to 4 weeks ago    Pain Frequency  Constant    Aggravating Factors   See above    Pain Relieving Factors  See above.         [facility] PT Assessment - 05/06/18 0001      Assessment   Medical Diagnosis  Right total shoulder replacement.    Referring Provider (PT)  Francena Hanly  MD    Onset Date/Surgical Date  --   04/18/18 (surgery date).     Precautions   Precautions  --   PLEASE FOLLOW PROTOCOL.     Balance Screen   Has the patient fallen in the past 6 months  No    Has the patient had a decrease in activity level because of a fear of falling?   No    Is the patient reluctant to leave their home because of a fear of falling?   No      Home Environment   Living Environment  Private residence      Prior Function   Level of Independence  Independent      Observation/Other Assessments   Focus on Therapeutic Outcomes (FOTO)   82% Limitation.      Posture/Postural Control   Posture Comments  Guarded.      ROM / Strength   AROM / PROM / Strength  PROM      PROM   Overall PROM Comments  In supine:  Passive left shoulder flexion= 65 degrees, ER= -30 degrees from  neutral and IR to abdomen.      Palpation   Palpation comment  Tender around right shoulder incisional site.      Ambulation/Gait   Gait Comments  WNL.                Objective measurements completed on examination: See above findings.      [facility] Adult PT Treatment/Exercise - 05/06/18 0001      Modalities   Modalities  Vasopneumatic      Vasopneumatic   Number Minutes Vasopneumatic   10 minutes    Vasopnuematic Location   --   Right shoulder with pillow between elbow and thorax.   Vasopneumatic Pressure  Low             PT Education - 05/06/18 1421    Education Details  HEP.    Person(s) Educated  Patient    Methods  Explanation;Demonstration;Tactile cues;Verbal cues;Handout    Comprehension  Verbalized understanding;Returned demonstration;Tactile cues required          PT Long Term Goals - 05/06/18 1646      PT LONG TERM GOAL #1   Title  Independent with a HEP.    Time  8    Period  Weeks    Status  New      PT LONG TERM GOAL #2   Title  Active right shoulder flexion to 145 degrees so the patient can easily reach overhead.    Time  8    Period   Weeks    Status  New      PT LONG TERM GOAL #3   Title  Active ER to 70 degrees+ to allow for easily donning/doffing of apparel.    Time  8    Status  New      PT LONG TERM GOAL #4   Title  Increase ROM so patient is able to reach behind back to L3.    Time  8    Period  Weeks    Status  New      PT LONG TERM GOAL #5   Title  Increase right shoulder strength to a solid 4+/5 to increase stability for performance of functional activities.    Time  8    Period  Weeks    Status  New             Plan - 05/06/18 1640    Clinical Impression Statement  The patient presents to [facility] s/p right total shoulder replacement performed on 04/18/18.  She is currently in a sling.  He has been doing some very low-level exercises at home including the pendulum.  He has very significant limitations of shoulder range of motion currently.  His right shoulder incisional site appears to be healing nicley.  He is tender around the site.  Patient will benefit from skilled physical therapy intervention per total shoulder protocol to address deficits and pain.    Clinical Presentation  Stable    Clinical Presentation due to:  Good surgical outcome.    Clinical Decision Making  Low    Rehab Potential  Excellent    PT Frequency  2x / week    PT Duration  8 weeks    PT Treatment/Interventions  ADLs/Self Care Home Management;Cryotherapy;Electrical Stimulation;Therapeutic exercise;Therapeutic activities;Neuromuscular re-education;Patient/family education;Passive range of motion;Manual techniques;Vasopneumatic Device    PT Next Visit Plan  Please progress patient per total shoulder protocol.  Vasopnuematic with pillow between thorax and right elbow.  Electrical stimulation if  needed.    Consulted and Agree with Plan of Care  Patient       Patient will benefit from skilled therapeutic intervention in order to improve the following deficits and impairments:  Pain, Decreased activity tolerance, Decreased range of  motion  Visit Diagnosis: Chronic right shoulder pain - Plan: PT plan of care cert/re-cert  Stiffness of right shoulder, not elsewhere classified - Plan: PT plan of care cert/re-cert     Problem List There are no active problems to display for this patient.   Kahleb Mcclane, [place] MPT 05/06/2018, 4:50 PM  [facility] [address] [place], [place], [address] Phone: [phone]   Fax:  [phone]  Name: Samuel Duran MRN: [id number] Date of Birth: 26-Sep-1970

## 2018-05-08 DIAGNOSIS — T63451D Toxic effect of venom of hornets, accidental (unintentional), subsequent encounter: Secondary | ICD-10-CM | POA: Diagnosis not present

## 2018-05-08 DIAGNOSIS — Z96611 Presence of right artificial shoulder joint: Secondary | ICD-10-CM | POA: Insufficient documentation

## 2018-05-09 ENCOUNTER — Ambulatory Visit: Payer: 59 | Admitting: Physical Therapy

## 2018-05-09 ENCOUNTER — Encounter: Payer: Self-pay | Admitting: Physical Therapy

## 2018-05-09 DIAGNOSIS — M25511 Pain in right shoulder: Principal | ICD-10-CM

## 2018-05-09 DIAGNOSIS — M25611 Stiffness of right shoulder, not elsewhere classified: Secondary | ICD-10-CM

## 2018-05-09 DIAGNOSIS — G8929 Other chronic pain: Secondary | ICD-10-CM

## 2018-05-09 NOTE — Therapy (Signed)
[facility] [address] [place], [place], [address] Phone: [phone]   Fax:  [phone]  Physical Therapy Treatment  Patient Details  Name: Samuel Duran MRN: [id number] Date of Birth: 08-01-1970 Referring Provider (PT): Francena HanlyKevin Supple MD   Encounter Date: 05/09/2018  PT End of Session - 05/09/18 1659    Visit Number  2    Number of Visits  16    Date for PT Re-Evaluation  07/01/18    PT Start Time  1600    PT Stop Time  1655    PT Time Calculation (min)  55 min    Activity Tolerance  Patient tolerated treatment well    Behavior During Therapy  [facility]       Past Medical History:  Diagnosis Date  . Allergy   . Anxiety   . Asthma    allergy induced asthma    Past Surgical History:  Procedure Laterality Date  . CYST EXCISION     forehead  . 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  Subjective Assessment - 05/09/18 1706    Subjective  Patient stated feeling alright and being compliant with HEP.    Pertinent History  Dysplasia of shoulder    Patient Stated Goals  Use arm without pain.    Currently in Pain?  Yes    Pain Score  3     Pain Location  Shoulder    Pain Orientation  Right    Pain Descriptors / Indicators  Dull;Aching;Discomfort    Pain Type  Surgical pain    Pain Onset  1 to 4 weeks ago    Pain Frequency  Constant         [facility] PT Assessment - 05/09/18 0001      Assessment   Medical Diagnosis  Right total shoulder replacement.    Referring Provider (PT)  Francena HanlyKevin Supple MD                   [facility]Ex[facility] - 05/09/18 0001      Modalities   Modalities  Vasopneumatic      Vasopneumatic   Number Minutes Vasopneumatic   15 minutes    Vasopnuematic Location    Shoulder    Vasopneumatic Pressure  Low      Manual Therapy   Manual Therapy  Passive ROM    Passive ROM  in supine: PROM to right shoulder in flexion and ER to improve ROM, intermittent oscillations to promote muscle relaxation                  PT Long Term Goals - 05/06/18 1646      PT LONG TERM GOAL #1   Title  Independent with a HEP.    Time  8    Period  Weeks    Status  New      PT LONG TERM GOAL #2   Title  Active right shoulder flexion to 145 degrees so the patient can easily reach overhead.    Time  8    Period  Weeks    Status  New      PT LONG TERM GOAL #3   Title  Active ER to 70 degrees+ to allow for easily donning/doffing of apparel.    Time  8    Status  New      PT LONG TERM GOAL #4   Title  Increase ROM so patient is able to reach behind back to L3.    Time  8    Period  Weeks    Status  New      PT LONG TERM GOAL #5   Title  Increase right shoulder strength to a solid 4+/5 to increase stability for performance of functional activities.    Time  8    Period  Weeks    Status  New            Plan - 05/09/18 1700    Clinical Impression Statement  Patient was able to tolerate treatment well with minimal reports of increased pain. Patient noted with intermittent guarding especially with PROM flexion. Intermittent oscillations performed to promote muscle relaxation. Patient is compliant with exercises at home but is unsure of how far to push his arm. Patient instructed to go to discomfort but not beyond pain. Patient reported understanding. Normal response to modalities upon removal.     Clinical Presentation  Stable    Clinical Decision Making  Low    Rehab Potential  Excellent    PT Frequency  2x / week    PT Duration  8 weeks    PT Treatment/Interventions  ADLs/Self Care Home Management;Cryotherapy;Electrical Stimulation;Therapeutic exercise;Therapeutic activities;Neuromuscular re-education;Patient/family education;Passive range of  motion;Manual techniques;Vasopneumatic Device    PT Next Visit Plan  Please progress patient per total shoulder protocol.  Vasopnuematic with pillow between thorax and right elbow.  Electrical stimulation if needed.    Consulted and Agree with Plan of Care  Patient       Patient will benefit from skilled therapeutic intervention in order to improve the following deficits and impairments:  Pain, Decreased activity tolerance, Decreased range of motion  Visit Diagnosis: Chronic right shoulder pain  Stiffness of right shoulder, not elsewhere classified     Problem List There are no active problems to display for this patient.  Guss Bunde, PT, DPT 05/09/2018, 5:10 PM  [facility] [address] [place], [place], [address] Phone: [phone]   Fax:  [phone]  Name: Samuel Duran MRN: [id number] Date of Birth: [birth year]

## 2018-05-13 ENCOUNTER — Encounter: Payer: Self-pay | Admitting: Physical Therapy

## 2018-05-13 ENCOUNTER — Ambulatory Visit: Payer: 59 | Admitting: Physical Therapy

## 2018-05-13 DIAGNOSIS — M25611 Stiffness of right shoulder, not elsewhere classified: Secondary | ICD-10-CM

## 2018-05-13 DIAGNOSIS — M25511 Pain in right shoulder: Principal | ICD-10-CM

## 2018-05-13 DIAGNOSIS — R600 Localized edema: Secondary | ICD-10-CM | POA: Diagnosis not present

## 2018-05-13 DIAGNOSIS — G8929 Other chronic pain: Secondary | ICD-10-CM

## 2018-05-13 NOTE — Therapy (Signed)
[facility] [address] [place], [place], [address] Phone: [phone]   Fax:  [phone]  Physical Therapy Treatment  Patient Details  Name: Samuel Duran MRN: [id number] Date of Birth: [birth year] Referring Provider (PT): Francena Hanly MD   Encounter Date: 05/13/2018  PT End of Session - 05/13/18 1652    Visit Number  3    Number of Visits  16    Date for PT Re-Evaluation  07/01/18    PT Start Time  1600    PT Stop Time  1651    PT Time Calculation (min)  51 min    Activity Tolerance  Patient tolerated treatment well    Behavior During Therapy  [facility] for tasks assessed/performed       Past Medical History:  Diagnosis Date  . Allergy   . Anxiety   . Asthma    allergy induced asthma    Past Surgical History:  Procedure Laterality Date  . CYST EXCISION     forehead  . 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  Subjective Assessment - 05/13/18 1652    Subjective  Patient feeling a little sore 2-3/10    Pertinent History  Dysplasia of shoulder    Patient Stated Goals  Use arm without pain.    Currently in Pain?  Yes    Pain Score  3     Pain Location  Shoulder    Pain Orientation  Right    Pain Descriptors / Indicators  Aching;Dull    Pain Type  Surgical pain    Pain Onset  1 to 4 weeks ago    Pain Frequency  Constant         [facility] PT Assessment - 05/13/18 0001      Assessment   Medical Diagnosis  Right total shoulder replacement.    Referring Provider (PT)  Francena Hanly MD      PROM   PROM Assessment Site  Shoulder    Right/Left Shoulder  Right    Right Shoulder Flexion  99 Degrees    Right Shoulder External Rotation  14 Degrees                   [facility] Adult PT Treatment/Exercise - 05/13/18 0001      Modalities   Modalities  Electrical Stimulation;Vasopneumatic      Electrical Stimulation   Electrical Stimulation Location  right shoulder    Electrical Stimulation Action  IFC    Electrical Stimulation Parameters  80-150 hz x15 min    Electrical Stimulation Goals  Pain      Vasopneumatic   Number Minutes Vasopneumatic   15 minutes    Vasopnuematic Location   Shoulder    Vasopneumatic Pressure  Low      Manual Therapy   Manual Therapy  Passive ROM    Passive ROM  in supine: PROM to right shoulder in flexion and ER to improve ROM, intermittent oscillations to promote muscle relaxation                  PT Long Term Goals - 05/06/18 1646      PT LONG TERM GOAL #1   Title  Independent with a HEP.    Time  8    Period  Weeks    Status  New      PT LONG TERM GOAL #2   Title  Active right shoulder flexion to 145 degrees so the patient can easily reach overhead.    Time  8    Period  Weeks    Status  New      PT LONG TERM GOAL #3   Title  Active ER to 70 degrees+ to allow for easily donning/doffing of apparel.    Time  8    Status  New      PT LONG TERM GOAL #4   Title  Increase ROM so patient is able to reach behind back to L3.    Time  8    Period  Weeks    Status  New      PT LONG TERM GOAL #5   Title  Increase right shoulder strength to a solid 4+/5 to increase stability for performance of functional activities.    Time  8    Period  Weeks    Status  New            Plan - 05/13/18 1653    Clinical Impression Statement  Patient was able to tolerate treatment well despite ongoing tightness at end range PROM. Patient continues to require intermittent oscillations to prevent muscle guarding but noted improved relaxation as PROM continued. Patient noted with improved PROM, see objective measure. Normal response to modalities upon removal.     Clinical Presentation  Stable    Clinical Decision Making  Low    Rehab Potential  Excellent    PT Frequency   2x / week    PT Duration  8 weeks    PT Treatment/Interventions  ADLs/Self Care Home Management;Cryotherapy;Electrical Stimulation;Therapeutic exercise;Therapeutic activities;Neuromuscular re-education;Patient/family education;Passive range of motion;Manual techniques;Vasopneumatic Device    PT Next Visit Plan  Please progress patient per total shoulder protocol.  Vasopnuematic with pillow between thorax and right elbow.  Electrical stimulation if needed.    Consulted and Agree with Plan of Care  Patient       Patient will benefit from skilled therapeutic intervention in order to improve the following deficits and impairments:  Pain, Decreased activity tolerance, Decreased range of motion  Visit Diagnosis: Chronic right shoulder pain  Stiffness of right shoulder, not elsewhere classified     Problem List There are no active problems to display for this patient.   Guss BundeKrystle Bliss Tsang, PT, DPT 05/13/2018, 4:57 PM  [facility] [address] [place], [place], [address] Phone: [phone]   Fax:  [phone]  Name: Samuel Duran MRN: [id number] Date of Birth: 25-Jun-1970

## 2018-05-15 DIAGNOSIS — T63451D Toxic effect of venom of hornets, accidental (unintentional), subsequent encounter: Secondary | ICD-10-CM | POA: Diagnosis not present

## 2018-05-16 ENCOUNTER — Ambulatory Visit: Payer: 59 | Admitting: Physical Therapy

## 2018-05-16 DIAGNOSIS — M25511 Pain in right shoulder: Secondary | ICD-10-CM | POA: Diagnosis not present

## 2018-05-16 DIAGNOSIS — G8929 Other chronic pain: Secondary | ICD-10-CM

## 2018-05-16 DIAGNOSIS — M25611 Stiffness of right shoulder, not elsewhere classified: Secondary | ICD-10-CM

## 2018-05-16 NOTE — Therapy (Signed)
[facility] [address] [place], [place], [address] Phone: [phone]   Fax:  [phone]  Physical Therapy Treatment  Patient Details  Name: Samuel Duran MRN: [id number] Date of Birth: 23-Feb-1971 Referring Provider (PT): Francena HanlyKevin Supple MD   Encounter Date: 05/16/2018  PT End of Session - 05/16/18 1729    Visit Number  4    Number of Visits  16    Date for PT Re-Evaluation  07/01/18    PT Start Time  0400    PT Stop Time  0452    PT Time Calculation (min)  52 min    Activity Tolerance  Patient tolerated treatment well    Behavior During Therapy  [facility] for tasks assessed/performed       Past Medical History:  Diagnosis Date  . Allergy   . Anxiety   . Asthma    allergy induced asthma    Past Surgical History:  Procedure Laterality Date  . CYST EXCISION     forehead  . 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  Subjective Assessment - 05/16/18 1640    Subjective  Still sore.  Been doing the home exercises.    Pertinent History  Dysplasia of shoulder    Patient Stated Goals  Use arm without pain.    Currently in Pain?  Yes    Pain Score  3     Pain Orientation  Right    Pain Descriptors / Indicators  Aching;Dull    Pain Onset  1 to 4 weeks ago                       [facility] 05/16/18 0001      Modalities   Modalities  Electrical Stimulation;Vasopneumatic      Electrical Stimulation   Electrical Stimulation Location  Right shoulder.    Electrical Stimulation Action  IFC    Electrical Stimulation Parameters  80-150 Hz x 20 minutes.    Electrical Stimulation Goals  Pain      Vasopneumatic   Number Minutes Vasopneumatic   20 minutes    Vasopnuematic Location   --   Right shoulder.   Pillow between rt elbow and thorax.   Vasopneumatic Pressure  Low      Manual Therapy   Manual Therapy  Passive ROM    Passive ROM  In supine:  PROM to patient's right shoulder x 23 minutes into flexion and ER including low load long duration stretching technique.                  PT Long Term Goals - 05/06/18 1646      PT LONG TERM GOAL #1   Title  Independent with a HEP.    Time  8    Period  Weeks    Status  New      PT LONG TERM GOAL #2   Title  Active right shoulder flexion to 145 degrees so the patient can easily reach overhead.    Time  8    Period  Weeks    Status  New      PT LONG TERM GOAL #3   Title  Active ER to 70 degrees+ to allow for easily donning/doffing of apparel.    Time  8    Status  New      PT LONG TERM GOAL #4   Title  Increase ROM so patient is able to reach behind back to L3.    Time  8    Period  Weeks    Status  New      PT LONG TERM GOAL #5   Title  Increase right shoulder strength to a solid 4+/5 to increase stability for performance of functional activities.    Time  8    Period  Weeks    Status  New              Patient will benefit from skilled therapeutic intervention in order to improve the following deficits and impairments:     Visit Diagnosis: Chronic right shoulder pain  Stiffness of right shoulder, not elsewhere classified     Problem List There are no active problems to display for this patient.   Alcee Sipos, [place] MPT 05/16/2018, 5:30 PM  [facility] [address] [place], [place], [address] Phone: [phone]   Fax:  [phone]  Name: Samuel Duran MRN: [id number] Date of Birth: 1970-06-28

## 2018-05-21 ENCOUNTER — Ambulatory Visit: Payer: 59 | Admitting: *Deleted

## 2018-05-21 DIAGNOSIS — M25511 Pain in right shoulder: Secondary | ICD-10-CM | POA: Diagnosis not present

## 2018-05-21 DIAGNOSIS — G8929 Other chronic pain: Secondary | ICD-10-CM

## 2018-05-21 DIAGNOSIS — T63441D Toxic effect of venom of bees, accidental (unintentional), subsequent encounter: Secondary | ICD-10-CM | POA: Diagnosis not present

## 2018-05-21 DIAGNOSIS — T63461D Toxic effect of venom of wasps, accidental (unintentional), subsequent encounter: Secondary | ICD-10-CM | POA: Diagnosis not present

## 2018-05-21 DIAGNOSIS — T63451D Toxic effect of venom of hornets, accidental (unintentional), subsequent encounter: Secondary | ICD-10-CM | POA: Diagnosis not present

## 2018-05-21 DIAGNOSIS — M25611 Stiffness of right shoulder, not elsewhere classified: Secondary | ICD-10-CM

## 2018-05-21 NOTE — Therapy (Signed)
[facility] [address] [place], [place], [address] Phone: [phone]   Fax:  [phone]  Physical Therapy Treatment  Patient Details  Name: Samuel GlennChristopher R Grantham MRN: [id number] Date of Birth: 11-14-1970 Referring Provider (PT): Francena HanlyKevin Supple MD   Encounter Date: 05/21/2018  PT End of Session - 05/21/18 1200    Visit Number  5    Number of Visits  16    Date for PT Re-Evaluation  07/01/18    PT Start Time  0900    PT Stop Time  0951    PT Time Calculation (min)  51 min       Past Medical History:  Diagnosis Date  . Allergy   . Anxiety   . Asthma    allergy induced asthma    Past Surgical History:  Procedure Laterality Date  . CYST EXCISION     forehead  . 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  Subjective Assessment - 05/21/18 0900    Subjective  Still sore in morning.  Been doing the home exercises.    Pertinent History  Dysplasia of shoulder    Patient Stated Goals  Use arm without pain.    Currently in Pain?  Yes    Pain Score  3     Pain Orientation  Right    Pain Descriptors / Indicators  Aching;Dull    Pain Type  Surgical pain    Pain Onset  1 to 4 weeks ago    Pain Frequency  Constant                       [facility] Adult PT Treatment/Exercise - 05/21/18 0001      Modalities   Modalities  Electrical Stimulation;Vasopneumatic;Cryotherapy      Cryotherapy   Number Minutes Cryotherapy  15 Minutes    Cryotherapy Location  Shoulder    Type of Cryotherapy  Ice pack      Electrical Stimulation   Electrical Stimulation Location  Right shoulder. IFC 80-150hz  x 15 min     Electrical Stimulation Goals  Pain      Vasopneumatic   Number Minutes Vasopneumatic   --    Vasopnuematic Location   --    Vasopneumatic  Pressure  --    Vasopneumatic Temperature   --      Manual Therapy   Manual Therapy  Passive ROM    Passive ROM  In supine:  PROM to patient's right shoulder  x  24 minutes into flexion and ER including low load long duration stretching technique.                  PT Long Term Goals - 05/06/18 1646      PT LONG TERM GOAL #1   Title  Independent with a HEP.    Time  8    Period  Weeks    Status  New      PT LONG TERM GOAL #2   Title  Active right shoulder flexion to 145 degrees so the patient can easily reach overhead.    Time  8    Period  Weeks    Status  New      PT LONG TERM GOAL #3   Title  Active ER to 70 degrees+ to  allow for easily donning/doffing of apparel.    Time  8    Status  New      PT LONG TERM GOAL #4   Title  Increase ROM so patient is able to reach behind back to L3.    Time  8    Period  Weeks    Status  New      PT LONG TERM GOAL #5   Title  Increase right shoulder strength to a solid 4+/5 to increase stability for performance of functional activities.    Time  8    Period  Weeks    Status  New            Plan - 05/21/18 0901    Clinical Impression Statement  Pt arrived today doing fairly well with RT shldr. He did well with PROM within restrictions Flexion to 95 degrees, ER 25 degrees and IR to 45 degrees.. Normal modality response.    Clinical Presentation  Stable    Rehab Potential  Excellent    PT Frequency  2x / week    PT Treatment/Interventions  ADLs/Self Care Home Management;Cryotherapy;Electrical Stimulation;Therapeutic exercise;Therapeutic activities;Neuromuscular re-education;Patient/family education;Passive range of motion;Manual techniques;Vasopneumatic Device    PT Next Visit Plan  Please progress patient per total shoulder protocol.  Vasopnuematic with pillow between thorax and right elbow.  Electrical stimulation if needed.    Consulted and Agree with Plan of Care  Patient       Patient will benefit from skilled  therapeutic intervention in order to improve the following deficits and impairments:  Pain, Decreased activity tolerance, Decreased range of motion  Visit Diagnosis: Chronic right shoulder pain  Stiffness of right shoulder, not elsewhere classified     Problem List There are no active problems to display for this patient.   Delailah Spieth,CHRIS, PTA 05/21/2018, 12:03 PM  [facility] [address] [place], [place], [address] Phone: [phone]   Fax:  [phone]  Name: Samuel GlennChristopher R Duran MRN: [id number] Date of Birth: 10-12-1970

## 2018-05-24 ENCOUNTER — Ambulatory Visit: Payer: 59 | Admitting: *Deleted

## 2018-05-24 DIAGNOSIS — M25511 Pain in right shoulder: Secondary | ICD-10-CM | POA: Diagnosis not present

## 2018-05-24 DIAGNOSIS — M25611 Stiffness of right shoulder, not elsewhere classified: Secondary | ICD-10-CM

## 2018-05-24 DIAGNOSIS — G8929 Other chronic pain: Secondary | ICD-10-CM

## 2018-05-24 NOTE — Therapy (Signed)
[facility] [address] [place], [place], [address] Phone: [phone]   Fax:  [phone]  Physical Therapy Treatment  Patient Details  Name: CASHUS HALTERMAN MRN: [id number] Date of Birth: February 09, 1971 Referring Provider (PT): Francena Hanly MD   Encounter Date: 05/24/2018  PT End of Session - 05/24/18 1227    Visit Number  6    Number of Visits  16    Date for PT Re-Evaluation  07/01/18    PT Start Time  0900    PT Stop Time  0951    PT Time Calculation (min)  51 min       Past Medical History:  Diagnosis Date  . Allergy   . Anxiety   . Asthma    allergy induced asthma    Past Surgical History:  Procedure Laterality Date  . CYST EXCISION     forehead  . 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  Subjective Assessment - 05/24/18 0904    Subjective  Still sore in morning.  Been doing the home exercises. 2/10 today    Pertinent History  Dysplasia of shoulder    Patient Stated Goals  Use arm without pain.    Currently in Pain?  Yes    Pain Score  2     Pain Location  Shoulder    Pain Orientation  Right    Pain Descriptors / Indicators  Aching    Pain Onset  1 to 4 weeks ago                       [facility] Adult PT Treatment/Exercise - 05/24/18 0001      Modalities   Modalities  Electrical Stimulation;Vasopneumatic;Cryotherapy      Cryotherapy   Number Minutes Cryotherapy  15 Minutes    Cryotherapy Location  Shoulder    Type of Cryotherapy  Ice pack      Electrical Stimulation   Electrical Stimulation Location  Right shoulder. IFC 80-150hz  x 15 min     Electrical Stimulation Goals  Pain      Manual Therapy   Manual Therapy  Passive ROM    Passive ROM  In supine:  PROM to patient's right shoulder  x  24 minutes  into flexion and ER including low load long duration stretching technique.                  PT Long Term Goals - 05/06/18 1646      PT LONG TERM GOAL #1   Title  Independent with a HEP.    Time  8    Period  Weeks    Status  New      PT LONG TERM GOAL #2   Title  Active right shoulder flexion to 145 degrees so the patient can easily reach overhead.    Time  8    Period  Weeks    Status  New      PT LONG TERM GOAL #3   Title  Active ER to 70 degrees+ to allow for easily donning/doffing of apparel.    Time  8    Status  New      PT LONG TERM GOAL #4   Title  Increase ROM so patient is able to reach behind back to  L3.    Time  8    Period  Weeks    Status  New      PT LONG TERM GOAL #5   Title  Increase right shoulder strength to a solid 4+/5 to increase stability for performance of functional activities.    Time  8    Period  Weeks    Status  New            Plan - 05/24/18 1228    Clinical Impression Statement  Pt arrived today doing fairly well with RT shldr. PROM/ PAROM was performed again with flexion to 100 degrees and ER to 30 degrees, and IR to abdomen. Vaso not available today so ice pack was used with estim with ormal responses.    Clinical Presentation  Stable    Rehab Potential  Excellent    PT Frequency  2x / week    PT Duration  8 weeks    PT Treatment/Interventions  ADLs/Self Care Home Management;Cryotherapy;Electrical Stimulation;Therapeutic exercise;Therapeutic activities;Neuromuscular re-education;Patient/family education;Passive range of motion;Manual techniques;Vasopneumatic Device    PT Next Visit Plan  Please progress patient per total shoulder protocol.  Vasopnuematic with pillow between thorax and right elbow.  Electrical stimulation if needed.    Consulted and Agree with Plan of Care  Patient       Patient will benefit from skilled therapeutic intervention in order to improve the following deficits and impairments:  Pain, Decreased  activity tolerance, Decreased range of motion  Visit Diagnosis: Chronic right shoulder pain  Stiffness of right shoulder, not elsewhere classified     Problem List There are no active problems to display for this patient.   Wauneta Silveria,CHRIS, PTA 05/24/2018, 12:33 PM  [facility] [address] [place], [place], [address] Phone: [phone]   Fax:  [phone]  Name: Tarri GlennChristopher R Ronning MRN: [id number] Date of Birth: 1971/03/18

## 2018-05-28 ENCOUNTER — Ambulatory Visit: Payer: 59 | Admitting: Physical Therapy

## 2018-05-28 ENCOUNTER — Encounter: Payer: Self-pay | Admitting: Physical Therapy

## 2018-05-28 DIAGNOSIS — M25511 Pain in right shoulder: Principal | ICD-10-CM

## 2018-05-28 DIAGNOSIS — G8929 Other chronic pain: Secondary | ICD-10-CM

## 2018-05-28 DIAGNOSIS — M25611 Stiffness of right shoulder, not elsewhere classified: Secondary | ICD-10-CM

## 2018-05-28 NOTE — Therapy (Signed)
[facility] [address] [place], [place], [address] Phone: [phone]   Fax:  [phone]  Physical Therapy Treatment  Patient Details  Name: Samuel Duran MRN: [id number] Date of Birth: 06/21/1970 Referring Provider (PT): Francena HanlyKevin Supple MD   Encounter Date: 05/28/2018  PT End of Session - 05/28/18 1214    Visit Number  7    Number of Visits  16    Date for PT Re-Evaluation  07/01/18    PT Start Time  1115    PT Stop Time  1209    PT Time Calculation (min)  54 min    Activity Tolerance  Patient tolerated treatment well    Behavior During Therapy  [facility] for [facility] assessed/performed       Past Medical History:  Diagnosis Date  . Allergy   . Anxiety   . Asthma    allergy induced asthma    Past Surgical History:  Procedure Laterality Date  . CYST EXCISION     forehead  . 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  Subjective Assessment - 05/28/18 1207    Subjective  No new complaints.    Pertinent History  Dysplasia of shoulder    Currently in Pain?  Yes    Pain Score  2     Pain Location  Shoulder    Pain Orientation  Right    Pain Descriptors / Indicators  Aching    Pain Type  Surgical pain    Pain Onset  1 to 4 weeks ago         [facility] - 05/28/18 0001      PROM   Right Shoulder Flexion  120 Degrees    Right Shoulder External Rotation  44 Degrees                   [facility] Adult PT Treatment/Exercise - 05/28/18 0001      Electrical Stimulation   Electrical Stimulation Location  Right shoulder.    Electrical Stimulation Action  IFC    Electrical Stimulation Parameters  80-150 Hz x 20 minutes.    Electrical Stimulation Goals  Pain      Vasopneumatic   Number Minutes Vasopneumatic   20 minutes     Vasopnuematic Location   --   Right shoulder.   Vasopneumatic Pressure  Low      Manual Therapy   Manual Therapy  Passive ROM    Passive ROM  In supine:  PROM in right shoulder flexion and ER x 23 minutes with low load long duration stretching technique utilized.                  PT Long Term Goals - 05/06/18 1646      PT LONG TERM GOAL #1   Title  Independent with a HEP.    Time  8    Period  Weeks    Status  New      PT LONG TERM GOAL #2   Title  Active right shoulder flexion to 145 degrees so the patient can easily reach overhead.    Time  8    Period  Weeks    Status  New      PT LONG TERM GOAL #3   Title  Active ER to 70  degrees+ to allow for easily donning/doffing of apparel.    Time  8    Status  New      PT LONG TERM GOAL #4   Title  Increase ROM so patient is able to reach behind back to L3.    Time  8    Period  Weeks    Status  New      PT LONG TERM GOAL #5   Title  Increase right shoulder strength to a solid 4+/5 to increase stability for performance of functional activities.    Time  8    Period  Weeks    Status  New            Plan - 05/28/18 1206    Clinical Impression Statement  Passive right shoulder flexion to 120 degrees and ER= 44 degrees.    PT Treatment/Interventions  ADLs/Self Care Home Management;Cryotherapy;Electrical Stimulation;Therapeutic exercise;Therapeutic activities;Neuromuscular re-education;Patient/family education;Passive range of motion;Manual techniques;Vasopneumatic Device    PT Next Visit Plan  Okay to begin pulleys and seated UE Ranger next visit.    Consulted and Agree with Plan of Care  Patient       Patient will benefit from skilled therapeutic intervention in order to improve the following deficits and impairments:     Visit Diagnosis: Chronic right shoulder pain  Stiffness of right shoulder, not elsewhere classified     Problem List There are no active problems to display for this  patient.   Filemon Breton, [place] MPT 05/28/2018, 12:15 PM  [facility] [address] [place], [place], [address] Phone: [phone]   Fax:  [phone]  Name: Samuel Duran MRN: [id number] Date of Birth: 25-Jul-1970

## 2018-05-30 ENCOUNTER — Ambulatory Visit: Payer: 59 | Attending: Orthopedic Surgery | Admitting: *Deleted

## 2018-05-30 DIAGNOSIS — M25611 Stiffness of right shoulder, not elsewhere classified: Secondary | ICD-10-CM | POA: Diagnosis not present

## 2018-05-30 DIAGNOSIS — M25511 Pain in right shoulder: Secondary | ICD-10-CM | POA: Diagnosis not present

## 2018-05-30 DIAGNOSIS — G8929 Other chronic pain: Secondary | ICD-10-CM | POA: Diagnosis not present

## 2018-05-30 NOTE — Therapy (Signed)
[facility] [address] [place], [place], [address] Phone: [phone]   Fax:  [phone]  Physical Therapy Treatment  Patient Details  Name: Samuel Duran MRN: [id number] Date of Birth: 04-Dec-1970 Referring Provider (PT): Francena HanlyKevin Supple MD   Encounter Date: 05/30/2018  PT End of Session - 05/30/18 1613    Visit Number  8    Number of Visits  16    Date for PT Re-Evaluation  07/01/18    PT Start Time  1600    PT Stop Time  1651    PT Time Calculation (min)  51 min       Past Medical History:  Diagnosis Date  . Allergy   . Anxiety   . Asthma    allergy induced asthma    Past Surgical History:  Procedure Laterality Date  . CYST EXCISION     forehead  . 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  Subjective Assessment - 05/30/18 1559    Subjective  RT shldr soreness, but ok    Pertinent History  Dysplasia of shoulder    Patient Stated Goals  Use arm without pain.    Currently in Pain?  Yes    Pain Score  2     Pain Location  Shoulder    Pain Orientation  Right    Pain Descriptors / Indicators  Aching;Sore    Pain Type  Surgical pain    Pain Onset  1 to 4 weeks ago    Pain Frequency  Constant                       [facility] Adult PT Treatment/Exercise - 05/30/18 0001      Exercises   Exercises  Shoulder      Shoulder Exercises: Pulleys   Flexion  5 minutes    Other Pulley Exercises  Sitting UE ranger all motions x 6 mins      Modalities   Modalities  Electrical Stimulation;Vasopneumatic;Cryotherapy      Electrical Stimulation   Electrical Stimulation Location  Right shoulder.    Electrical Stimulation Action  IFC    Electrical Stimulation Parameters  80-120 Hz    Electrical Stimulation Goals  Pain      Vasopneumatic   Number Minutes Vasopneumatic   15 minutes    Vasopnuematic Location   Shoulder    Vasopneumatic Pressure  Low    Vasopneumatic Temperature   36      Manual Therapy   Manual Therapy  Passive ROM    Passive ROM  In supine:  PROM in right shoulder flexion and ER degrees x 23 minutes with low load long duration stretching technique utilized.                  PT Long Term Goals - 05/06/18 1646      PT LONG TERM GOAL #1   Title  Independent with a HEP.    Time  8    Period  Weeks    Status  New      PT LONG TERM GOAL #2   Title  Active right shoulder flexion to 145 degrees so the patient can easily reach overhead.    Time  8    Period  Weeks    Status  New      PT LONG TERM GOAL #3   Title  Active ER to 70 degrees+ to allow for easily donning/doffing of apparel.    Time  8    Status  New      PT LONG TERM GOAL #4   Title  Increase ROM so patient is able to reach behind back to L3.    Time  8    Period  Weeks    Status  New      PT LONG TERM GOAL #5   Title  Increase right shoulder strength to a solid 4+/5 to increase stability for performance of functional activities.    Time  8    Period  Weeks    Status  New            Plan - 05/30/18 1746    Clinical Impression Statement  Pt arrived today with mainly soreness in RT shldr. He did well with AAROM with pulleys and seated UE ranger. Manual PROM performed with flexion to 120 degrees and ER to 40 degrees. Normal modality  response.    Rehab Potential  Excellent    PT Frequency  2x / week    PT Treatment/Interventions  ADLs/Self Care Home Management;Cryotherapy;Electrical Stimulation;Therapeutic exercise;Therapeutic activities;Neuromuscular re-education;Patient/family education;Passive range of motion;Manual techniques;Vasopneumatic Device    PT Next Visit Plan  Okay to begin pulleys and seated UE Ranger     Consulted and Agree with Plan of Care  Patient       Patient will benefit from  skilled therapeutic intervention in order to improve the following deficits and impairments:  Pain, Decreased activity tolerance, Decreased range of motion  Visit Diagnosis: Chronic right shoulder pain  Stiffness of right shoulder, not elsewhere classified     Problem List There are no active problems to display for this patient.   ,CHRIS, PTA 05/30/2018, 5:49 PM  [facility] [address] [place], [place], [address] Phone: [phone]   Fax:  [phone]  Name: Samuel GlennChristopher R Duran MRN: [id number] Date of Birth: August 22, 1970

## 2018-06-03 ENCOUNTER — Ambulatory Visit: Payer: 59 | Admitting: Physical Therapy

## 2018-06-03 ENCOUNTER — Encounter: Payer: Self-pay | Admitting: Physical Therapy

## 2018-06-03 DIAGNOSIS — M25511 Pain in right shoulder: Secondary | ICD-10-CM | POA: Diagnosis not present

## 2018-06-03 DIAGNOSIS — G8929 Other chronic pain: Secondary | ICD-10-CM

## 2018-06-03 DIAGNOSIS — M25611 Stiffness of right shoulder, not elsewhere classified: Secondary | ICD-10-CM

## 2018-06-03 NOTE — Therapy (Signed)
[facility] [address] [place], [place], [address] Phone: [phone]   Fax:  [phone]  Physical Therapy Treatment  Patient Details  Name: MICKIE FELGER MRN: [id number] Date of Birth: January 30, 1971 Referring Provider (PT): Francena Hanly MD   Encounter Date: 06/03/2018  PT End of Session - 06/03/18 0808    Visit Number  9    Number of Visits  16    Date for PT Re-Evaluation  07/01/18    Authorization Type  FOTO; progress note every 10th visit    PT Start Time  0731   arrived earlier than check in time   PT Stop Time  0820    PT Time Calculation (min)  49 min    Activity Tolerance  Patient tolerated treatment well    Behavior During Therapy  [facility] for tasks assessed/performed       Past Medical History:  Diagnosis Date  . Allergy   . Anxiety   . Asthma    allergy induced asthma    Past Surgical History:  Procedure Laterality Date  . CYST EXCISION     forehead  . 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  Subjective Assessment - 06/03/18 0806    Subjective  Reports feeling stiff this morning in right shoulder.    Pertinent History  Dysplasia of shoulder    Patient Stated Goals  Use arm without pain.    Currently in Pain?  Yes    Pain Score  4     Pain Location  Shoulder    Pain Orientation  Right    Pain Descriptors / Indicators  Aching;Tightness;Sore    Pain Type  Surgical pain    Pain Onset  1 to 4 weeks ago    Pain Frequency  Constant         [facility] PT Assessment - 06/03/18 0001      Assessment   Medical Diagnosis  Right total shoulder replacement.                   [facility] Adult PT Treatment/Exercise - 06/03/18 0001      Exercises   Exercises  Shoulder      Shoulder Exercises: Pulleys   Other Pulley  Exercises  Sitting UE ranger all motions x 9 mins (3 minutes each)      Modalities   Modalities  Electrical Stimulation;Vasopneumatic      Electrical Stimulation   Electrical Stimulation Location  Right shoulder.    Electrical Stimulation Action  IFC    Electrical Stimulation Parameters  80-150 hz x15 min    Electrical Stimulation Goals  Pain      Vasopneumatic   Number Minutes Vasopneumatic   15 minutes    Vasopnuematic Location   Shoulder    Vasopneumatic Pressure  Low    Vasopneumatic Temperature   36      Manual Therapy   Manual Therapy  Passive ROM    Passive ROM  In supine:  PROM in right shoulder flexion , ABD, and ER with intermittent oscillations to promote muscle relaxation                  PT Long Term Goals - 05/06/18 1646      PT LONG TERM GOAL #1   Title  Independent with a  HEP.    Time  8    Period  Weeks    Status  New      PT LONG TERM GOAL #2   Title  Active right shoulder flexion to 145 degrees so the patient can easily reach overhead.    Time  8    Period  Weeks    Status  New      PT LONG TERM GOAL #3   Title  Active ER to 70 degrees+ to allow for easily donning/doffing of apparel.    Time  8    Status  New      PT LONG TERM GOAL #4   Title  Increase ROM so patient is able to reach behind back to L3.    Time  8    Period  Weeks    Status  New      PT LONG TERM GOAL #5   Title  Increase right shoulder strength to a solid 4+/5 to increase stability for performance of functional activities.    Time  8    Period  Weeks    Status  New            Plan - 06/03/18 4010    Clinical Impression Statement  Patient was able to tolerate treatment well and reports being compliant with pulleys at home. Patient reported fatigue with seated UE ranger circles but was able to continue exercises without rest. Normal response to modalities upon removal.     Clinical Presentation  Stable    Clinical Decision Making  Low    Rehab Potential   Excellent    PT Frequency  2x / week    PT Duration  8 weeks    PT Treatment/Interventions  ADLs/Self Care Home Management;Cryotherapy;Electrical Stimulation;Therapeutic exercise;Therapeutic activities;Neuromuscular re-education;Patient/family education;Passive range of motion;Manual techniques;Vasopneumatic Device    PT Next Visit Plan  FOTO; 10th visit progress note; continue POC pulleys and seated UE Ranger, PROM, modalities for pain relief.    Consulted and Agree with Plan of Care  Patient       Patient will benefit from skilled therapeutic intervention in order to improve the following deficits and impairments:  Pain, Decreased activity tolerance, Decreased range of motion  Visit Diagnosis: Chronic right shoulder pain  Stiffness of right shoulder, not elsewhere classified     Problem List There are no active problems to display for this patient.  Guss Bunde, PT, DPT 06/03/2018, 8:23 AM  [facility] [address] [place], [place], [address] Phone: [phone]   Fax:  [phone]  Name: JACION BEGGS MRN: [id number] Date of Birth: 01-Aug-1970

## 2018-06-04 ENCOUNTER — Encounter: Payer: Self-pay | Admitting: Physical Therapy

## 2018-06-04 ENCOUNTER — Ambulatory Visit: Payer: 59 | Admitting: Physical Therapy

## 2018-06-04 DIAGNOSIS — M25611 Stiffness of right shoulder, not elsewhere classified: Secondary | ICD-10-CM

## 2018-06-04 DIAGNOSIS — T63451D Toxic effect of venom of hornets, accidental (unintentional), subsequent encounter: Secondary | ICD-10-CM | POA: Diagnosis not present

## 2018-06-04 DIAGNOSIS — T63461D Toxic effect of venom of wasps, accidental (unintentional), subsequent encounter: Secondary | ICD-10-CM | POA: Diagnosis not present

## 2018-06-04 DIAGNOSIS — T63441D Toxic effect of venom of bees, accidental (unintentional), subsequent encounter: Secondary | ICD-10-CM | POA: Diagnosis not present

## 2018-06-04 DIAGNOSIS — M25511 Pain in right shoulder: Secondary | ICD-10-CM | POA: Diagnosis not present

## 2018-06-04 DIAGNOSIS — G8929 Other chronic pain: Secondary | ICD-10-CM

## 2018-06-04 NOTE — Therapy (Signed)
[facility] [address] [place], [place], [address] Phone: [phone]   Fax:  [phone]  Physical Therapy Treatment  Progress Note Reporting Period 05/03/18 to 06/04/2018  See note below for Objective Data and Assessment of Progress/Goals.      Patient Details  Name: Samuel Duran MRN: [id number] Date of Birth: March 18, 1971 Referring Provider (PT): Francena Hanly MD   Encounter Date: 06/04/2018  PT End of Session - 06/04/18 0742    Visit Number  10    Number of Visits  16    Date for PT Re-Evaluation  07/01/18    Authorization Type  FOTO; progress note every 10th visit    PT Start Time  0730    PT Stop Time  0822    PT Time Calculation (min)  52 min    Activity Tolerance  Patient tolerated treatment well    Behavior During Therapy  [facility] for tasks assessed/performed       Past Medical History:  Diagnosis Date  . Allergy   . Anxiety   . Asthma    allergy induced asthma    Past Surgical History:  Procedure Laterality Date  . CYST EXCISION     forehead  . 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  Subjective Assessment - 06/04/18 0733    Subjective  Patient reports MD appointment went well    Pertinent History  Dysplasia of shoulder    Patient Stated Goals  Use arm without pain.    Currently in Pain?  Yes    Pain Score  1     Pain Location  Shoulder    Pain Orientation  Right    Pain Descriptors / Indicators  Aching;Tightness    Pain Type  Surgical pain    Pain Onset  1 to 4 weeks ago    Pain Frequency  Constant         [facility] PT Assessment - 06/04/18 0001      Assessment   Medical Diagnosis  Right total shoulder replacement.    Next MD Visit  07/15/2018      Observation/Other Assessments   Focus on Therapeutic Outcomes  (FOTO)   48% limitation                   [facility] Adult PT Treatment/Exercise - 06/04/18 0001      Exercises   Exercises  Shoulder      Shoulder Exercises: Supine   Protraction  AAROM;Both;20 reps    External Rotation  AAROM;Right;20 reps    Flexion  AAROM;Both;20 reps      Shoulder Exercises: Pulleys   Flexion  5 minutes    Other Pulley Exercises  Sitting UE ranger all motions x 9 mins (3 minutes each)      Modalities   Modalities  Electrical Stimulation;Vasopneumatic      Electrical Stimulation   Electrical Stimulation Location  Right shoulder.    Electrical Stimulation Action  IFC    Electrical Stimulation Parameters  80-150 hz x15 min    Electrical Stimulation Goals  Pain      Vasopneumatic   Number Minutes Vasopneumatic   15 minutes    Vasopnuematic Location   Shoulder    Vasopneumatic Pressure  Low    Vasopneumatic Temperature   36  Manual Therapy   Manual Therapy  Passive ROM    Passive ROM  In supine:  PROM in right shoulder ER with intermittent oscillations to promote muscle relaxation                   PT Long Term Goals - 06/04/18 0831      PT LONG TERM GOAL #1   Title  Independent with a HEP.    Time  8    Period  Weeks    Status  On-going      PT LONG TERM GOAL #2   Title  Active right shoulder flexion to 145 degrees so the patient can easily reach overhead.    Period  Weeks    Status  On-going      PT LONG TERM GOAL #3   Title  Active ER to 70 degrees+ to allow for easily donning/doffing of apparel.    Time  8    Status  On-going      PT LONG TERM GOAL #4   Title  Increase ROM so patient is able to reach behind back to L3.    Time  8    Period  Weeks    Status  On-going      PT LONG TERM GOAL #5   Title  Increase right shoulder strength to a solid 4+/5 to increase stability for performance of functional activities.    Time  8    Period  Weeks    Status  On-going            Plan - 06/04/18 0810     Clinical Impression Statement  Patient was able to tolerate progression of treatment well but reported muscle fatigue with new AAROM exercises. Patient was able to tolerate ER PROM fairly well but still continues to have deficits and pain towards end range. Goals are ongoing at this time. FOTO limitation 46%. Normal response to modalities upon removal.    Clinical Presentation  Stable    Clinical Decision Making  Low    Rehab Potential  Excellent    PT Frequency  2x / week    PT Duration  8 weeks    PT Treatment/Interventions  ADLs/Self Care Home Management;Cryotherapy;Electrical Stimulation;Therapeutic exercise;Therapeutic activities;Neuromuscular re-education;Patient/family education;Passive range of motion;Manual techniques;Vasopneumatic Device    PT Next Visit Plan  continue POC pulleys and seated UE Ranger, PROM, modalities for pain relief.    Consulted and Agree with Plan of Care  Patient       Patient will benefit from skilled therapeutic intervention in order to improve the following deficits and impairments:  Pain, Decreased activity tolerance, Decreased range of motion  Visit Diagnosis: Chronic right shoulder pain  Stiffness of right shoulder, not elsewhere classified     Problem List There are no active problems to display for this patient.  Guss BundeKrystle Aleck Locklin, PT, DPT 06/04/2018, 8:34 AM  [facility] [address] [place], [place], [address] Phone: [phone]   Fax:  [phone]  Name: Samuel Duran MRN: [id number] Date of Birth: 09/23/1970

## 2018-06-12 ENCOUNTER — Ambulatory Visit: Payer: 59 | Admitting: Physical Therapy

## 2018-06-12 ENCOUNTER — Encounter: Payer: Self-pay | Admitting: Physical Therapy

## 2018-06-12 DIAGNOSIS — M25511 Pain in right shoulder: Principal | ICD-10-CM

## 2018-06-12 DIAGNOSIS — G8929 Other chronic pain: Secondary | ICD-10-CM

## 2018-06-12 DIAGNOSIS — M25611 Stiffness of right shoulder, not elsewhere classified: Secondary | ICD-10-CM

## 2018-06-12 NOTE — Therapy (Signed)
[facility] [address] [place], [place], [address] Phone: [phone]   Fax:  [phone]  Physical Therapy Treatment  Patient Details  Name: Samuel Duran MRN: [id number] Date of Birth: 06-29-1970 Referring Provider (PT): Francena HanlyKevin Supple MD   Encounter Date: 06/12/2018  PT End of Session - 06/12/18 0826    Visit Number  11    Number of Visits  16    Date for PT Re-Evaluation  07/01/18    Authorization Type  FOTO; progress note every 10th visit    PT Start Time  0731    PT Stop Time  0825    PT Time Calculation (min)  54 min    Activity Tolerance  Patient tolerated treatment well    Behavior During Therapy  [facility] for tasks assessed/performed       Past Medical History:  Diagnosis Date  . Allergy   . Anxiety   . Asthma    allergy induced asthma    Past Surgical History:  Procedure Laterality Date  . CYST EXCISION     forehead  . 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  Subjective Assessment - 06/12/18 0826    Subjective  Arrives feeling alright with minimal pain.    Pertinent History  Dysplasia of shoulder    Patient Stated Goals  Use arm without pain.    Currently in Pain?  Yes    Pain Score  1     Pain Location  Shoulder    Pain Orientation  Right    Pain Descriptors / Indicators  Aching;Tightness    Pain Type  Surgical pain    Pain Onset  More than a month ago    Pain Frequency  Constant         [facility] PT Assessment - 06/12/18 0001      Assessment   Medical Diagnosis  Right total shoulder replacement.    Onset Date/Surgical Date  04/18/18    Next MD Visit  07/15/2018                   [facility]/Ex[facility] - 06/12/18 0001      Exercises   Exercises  Shoulder      Shoulder Exercises: Pulleys    Flexion  5 minutes    Other Pulley Exercises  Sitting UE ranger all motions x 9 mins (3 minutes each)      Shoulder Exercises: Isometric Strengthening   Flexion  5X5"    Extension  5X5"    External Rotation  5X5"    ABduction  5X5"      Shoulder Exercises: Stretch   Internal Rotation Stretch  3 reps    Internal Rotation Stretch Limitations  30" with cane    External Rotation Stretch  3 reps;30 seconds;Other (comment)   in doorway at 0 abduction and 90 abduction     Modalities   Modalities  Electrical Stimulation;Vasopneumatic      Electrical Stimulation   Electrical Stimulation Location  Right shoulder.    Electrical Stimulation Action  IFC    Electrical Stimulation Parameters  80-150 hz x15 mins    Electrical Stimulation Goals  Pain      Vasopneumatic   Number Minutes Vasopneumatic   15 minutes    Vasopnuematic Location   Shoulder  Vasopneumatic Pressure  Low    Vasopneumatic Temperature   36                  PT Long Term Goals - 06/04/18 0831      PT LONG TERM GOAL #1   Title  Independent with a HEP.    Time  8    Period  Weeks    Status  On-going      PT LONG TERM GOAL #2   Title  Active right shoulder flexion to 145 degrees so the patient can easily reach overhead.    Period  Weeks    Status  On-going      PT LONG TERM GOAL #3   Title  Active ER to 70 degrees+ to allow for easily donning/doffing of apparel.    Time  8    Status  On-going      PT LONG TERM GOAL #4   Title  Increase ROM so patient is able to reach behind back to L3.    Time  8    Period  Weeks    Status  On-going      PT LONG TERM GOAL #5   Title  Increase right shoulder strength to a solid 4+/5 to increase stability for performance of functional activities.    Time  8    Period  Weeks    Status  On-going            Plan - 06/12/18 0827    Clinical Impression Statement  Patient was able to tolerate treatment well but reported muscle fatigue with seated UE ranger  exercises. Patient guided through isometrics and stretching to which patient demonstrate good form and technique. Patient denied any increase of pain with new TEs. Patient provided with new HEP consisting of stretching for ER and IR. Patient reported understanding. Normal response to modalities upon removal.    Clinical Presentation  Stable    Clinical Decision Making  Low    Rehab Potential  Excellent    PT Frequency  2x / week    PT Duration  8 weeks    PT Treatment/Interventions  ADLs/Self Care Home Management;Cryotherapy;Electrical Stimulation;Therapeutic exercise;Therapeutic activities;Neuromuscular re-education;Patient/family education;Passive range of motion;Manual techniques;Vasopneumatic Device    PT Next Visit Plan  continue POC pulleys and seated UE Ranger, PROM, modalities for pain relief.    Consulted and Agree with Plan of Care  Patient       Patient will benefit from skilled therapeutic intervention in order to improve the following deficits and impairments:  Pain, Decreased activity tolerance, Decreased range of motion  Visit Diagnosis: Chronic right shoulder pain  Stiffness of right shoulder, not elsewhere classified     Problem List There are no active problems to display for this patient.  Guss Bunde, PT, DPT 06/12/2018, 8:29 AM  [facility] [address] [place], [place], [address] Phone: [phone]   Fax:  [phone]  Name: Samuel Duran MRN: [id number] Date of Birth: [birth year]

## 2018-06-13 ENCOUNTER — Encounter: Payer: Self-pay | Admitting: Physical Therapy

## 2018-06-13 ENCOUNTER — Ambulatory Visit: Payer: 59 | Admitting: Physical Therapy

## 2018-06-13 DIAGNOSIS — G8929 Other chronic pain: Secondary | ICD-10-CM

## 2018-06-13 DIAGNOSIS — M25511 Pain in right shoulder: Secondary | ICD-10-CM | POA: Diagnosis not present

## 2018-06-13 DIAGNOSIS — M25611 Stiffness of right shoulder, not elsewhere classified: Secondary | ICD-10-CM

## 2018-06-13 NOTE — Therapy (Signed)
[facility] [address] [place], [place], [address] Phone: [phone]   Fax:  [phone]  Physical Therapy Treatment  Patient Details  Name: Samuel GlennChristopher R Apostol MRN: [id number] Date of Birth: 01-27-1971 Referring Provider (PT): Francena HanlyKevin Supple MD   Encounter Date: 06/13/2018  PT End of Session - 06/13/18 1651    Visit Number  12    Number of Visits  16    Date for PT Re-Evaluation  07/01/18    Authorization Type  FOTO; progress note every 10th visit    PT Start Time  1647    PT Stop Time  1738    PT Time Calculation (min)  51 min    Activity Tolerance  Patient tolerated treatment well    Behavior During Therapy  [facility] for tasks assessed/performed       Past Medical History:  Diagnosis Date  . Allergy   . Anxiety   . Asthma    allergy induced asthma    Past Surgical History:  Procedure Laterality Date  . CYST EXCISION     forehead  . 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  Subjective Assessment - 06/13/18 1650    Subjective  Reports soreness after previous visit.    Pertinent History  Dysplasia of shoulder    Patient Stated Goals  Use arm without pain.    Currently in Pain?  Yes    Pain Score  3     Pain Location  Shoulder    Pain Orientation  Right    Pain Descriptors / Indicators  Sore    Pain Type  Surgical pain    Pain Onset  More than a month ago         [facility] - 06/13/18 0001      Assessment   Medical Diagnosis  Right total shoulder replacement.    Referring Provider (PT)  Francena HanlyKevin Supple MD    Onset Date/Surgical Date  04/18/18    Next MD Visit  07/15/2018                   [facility] Treatment/Exercise - 06/13/18 0001      Shoulder Exercises: Supine   Flexion  AROM;Right;20 reps      Shoulder Exercises: Prone   Retraction  AROM;Right;20 reps      Shoulder Exercises: Sidelying   External Rotation  AROM;Right;20 reps    Flexion  AROM;Right;20 reps      Shoulder Exercises: Pulleys   Flexion  5 minutes    Other Pulley Exercises  Sitting UE ranger all motions x 4 mins (2 minutes each)      Shoulder Exercises: Isometric Strengthening   Flexion  5X5"    Extension  5X5"    External Rotation  5X5"    Internal Rotation  5X5"      Shoulder Exercises: Stretch   Internal Rotation Stretch  3 reps    Internal Rotation Stretch Limitations  30" with cane    External Rotation Stretch  3 reps;30 seconds;Other (comment)      Modalities   Modalities  Programmer, applicationslectrical Stimulation;Vasopneumatic      Electrical Stimulation   Electrical Stimulation Location  R shoulder    Electrical Stimulation Action  IFC    Electrical Stimulation Parameters  80-150 hz x15 min  Electrical Stimulation Goals  Pain      Vasopneumatic   Number Minutes Vasopneumatic   15 minutes    Vasopnuematic Location   Shoulder    Vasopneumatic Pressure  Low    Vasopneumatic Temperature   67      Manual Therapy   Manual Therapy  Passive ROM    Passive ROM  PROM of R shoulder into flexion, ER, IR with gentle holds at end range                  PT Long Term Goals - 06/04/18 0831      PT LONG TERM GOAL #1   Title  Independent with a HEP.    Time  8    Period  Weeks    Status  On-going      PT LONG TERM GOAL #2   Title  Active right shoulder flexion to 145 degrees so the patient can easily reach overhead.    Period  Weeks    Status  On-going      PT LONG TERM GOAL #3   Title  Active ER to 70 degrees+ to allow for easily donning/doffing of apparel.    Time  8    Status  On-going      PT LONG TERM GOAL #4   Title  Increase ROM so patient is able to reach behind back to L3.    Time  8    Period  Weeks    Status  On-going      PT LONG TERM GOAL #5   Title  Increase right shoulder  strength to a solid 4+/5 to increase stability for performance of functional activities.    Time  8    Period  Weeks    Status  On-going            Plan - 06/13/18 1726    Clinical Impression Statement  Patient presented in clinic with reports of soreness in R shoulder. Patient continues to present with tightness with IR stretch. No complaints of discomfort with R shoulder isometrics. New AROM exercises completed today in antigravity with no complaints from patient. Firm end feels and smooth arc of motion noted during PROM of R shoulder. Normal modalities response noted following removal of the modalities.    Rehab Potential  Excellent    PT Frequency  2x / week    PT Duration  8 weeks    PT Treatment/Interventions  ADLs/Self Care Home Management;Cryotherapy;Electrical Stimulation;Therapeutic exercise;Therapeutic activities;Neuromuscular re-education;Patient/family education;Passive range of motion;Manual techniques;Vasopneumatic Device    PT Next Visit Plan  continue POC pulleys and seated UE Ranger, PROM, modalities for pain relief.    Consulted and Agree with Plan of Care  Patient       Patient will benefit from skilled therapeutic intervention in order to improve the following deficits and impairments:  Pain, Decreased activity tolerance, Decreased range of motion  Visit Diagnosis: Chronic right shoulder pain  Stiffness of right shoulder, not elsewhere classified     Problem List There are no active problems to display for this patient.   Marvell FullerKelsey P Kane Kusek, PTA 06/13/2018, 5:44 PM  [facility] [address] [place], [place], [address] Phone: [phone]   Fax:  [phone]  Name: Samuel GlennChristopher R Schuler MRN: [id number] Date of Birth: Sep 29, 1970

## 2018-06-17 ENCOUNTER — Ambulatory Visit: Payer: 59 | Admitting: Physical Therapy

## 2018-06-17 ENCOUNTER — Encounter: Payer: Self-pay | Admitting: Physical Therapy

## 2018-06-17 DIAGNOSIS — M25511 Pain in right shoulder: Principal | ICD-10-CM

## 2018-06-17 DIAGNOSIS — G8929 Other chronic pain: Secondary | ICD-10-CM

## 2018-06-17 DIAGNOSIS — M25611 Stiffness of right shoulder, not elsewhere classified: Secondary | ICD-10-CM

## 2018-06-17 NOTE — Therapy (Signed)
[facility] [address] [place], [place], [address] Phone: [phone]   Fax:  [phone]  Physical Therapy Treatment  Patient Details  Name: Samuel Duran MRN: [id number] Date of Birth: 11-07-1970 Referring Provider (PT): Francena Hanly MD   Encounter Date: 06/17/2018  PT End of Session - 06/17/18 0853    Visit Number  13    Number of Visits  16    Date for PT Re-Evaluation  07/01/18    Authorization Type  FOTO; progress note every 10th visit    PT Start Time  0816    PT Stop Time  0908    PT Time Calculation (min)  52 min    Activity Tolerance  Patient tolerated treatment well    Behavior During Therapy  [facility] for tasks assessed/performed       Past Medical History:  Diagnosis Date  . Allergy   . Anxiety   . Asthma    allergy induced asthma    Past Surgical History:  Procedure Laterality Date  . CYST EXCISION     forehead  . 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  Subjective Assessment - 06/17/18 0818    Subjective  Patient arrived with some ongoing soreness, no c/o after last treatment    Pertinent History  Dysplasia of shoulder    Patient Stated Goals  Use arm without pain.    Currently in Pain?  Yes    Pain Score  2     Pain Location  Shoulder    Pain Orientation  Right    Pain Descriptors / Indicators  Sore    Pain Type  Surgical pain    Pain Onset  More than a month ago    Pain Frequency  Constant    Aggravating Factors   increased movements    Pain Relieving Factors  at rest         [facility] PT Assessment - 06/17/18 0001      ROM / Strength   AROM / PROM / Strength  AROM;PROM      AROM   AROM Assessment Site  Shoulder    Right/Left Shoulder  Right    Right Shoulder External Rotation  56 Degrees      PROM   PROM Assessment Site  Shoulder    Right/Left Shoulder  Right    Right Shoulder Flexion  131 Degrees    Right Shoulder External Rotation  65 Degrees                   [facility] Adult PT Treatment/Exercise - 06/17/18 0001      Shoulder Exercises: Supine   Flexion  AROM;Right;20 reps    Other Supine Exercises  cane flexion 2x10      Shoulder Exercises: Prone   Retraction  AROM;Right;20 reps;10 reps   kneeling   Extension  Strengthening;Right;20 reps;10 reps   kneeling     Shoulder Exercises: Sidelying   External Rotation  AROM;Right;20 reps;10 reps    Flexion  AROM;Right;20 reps;10 reps      Shoulder Exercises: Pulleys   Flexion  5 minutes    Other Pulley Exercises  Sitting UE ranger all motions x 4 mins (2 minutes each)      Electrical Stimulation   Electrical Stimulation Location  R shoulder  Engineer, manufacturing  IFC    Electrical Stimulation Parameters  80-150hz  x30min    Electrical Stimulation Goals  Pain      Vasopneumatic   Number Minutes Vasopneumatic   15 minutes    Vasopnuematic Location   Shoulder    Vasopneumatic Pressure  Low      Manual Therapy   Manual Therapy  Passive ROM    Passive ROM  PROM of R shoulder into flexion, ER, IR with gentle holds at end range                  PT Long Term Goals - 06/04/18 0831      PT LONG TERM GOAL #1   Title  Independent with a HEP.    Time  8    Period  Weeks    Status  On-going      PT LONG TERM GOAL #2   Title  Active right shoulder flexion to 145 degrees so the patient can easily reach overhead.    Period  Weeks    Status  On-going      PT LONG TERM GOAL #3   Title  Active ER to 70 degrees+ to allow for easily donning/doffing of apparel.    Time  8    Status  On-going      PT LONG TERM GOAL #4   Title  Increase ROM so patient is able to reach behind back to L3.    Time  8    Period  Weeks    Status  On-going      PT LONG TERM GOAL #5   Title  Increase right shoulder  strength to a solid 4+/5 to increase stability for performance of functional activities.    Time  8    Period  Weeks    Status  On-going            Plan - 06/17/18 7680    Clinical Impression Statement  Patient tolerated treatment well today. Patient progressing with AAROM to AROM with no increased soreness and good form and control. Patient able to progress per protocoil and improving with ROM. Patient current goals ongoing at this time.     Rehab Potential  Excellent    Clinical Impairments Affecting Rehab Potential  surgery 04/18/18 current 9 weeks 06/20/18    PT Frequency  2x / week    PT Duration  8 weeks    PT Treatment/Interventions  ADLs/Self Care Home Management;Cryotherapy;Electrical Stimulation;Therapeutic exercise;Therapeutic activities;Neuromuscular re-education;Patient/family education;Passive range of motion;Manual techniques;Vasopneumatic Device    PT Next Visit Plan  Cont with POC per protocol    Consulted and Agree with Plan of Care  Patient       Patient will benefit from skilled therapeutic intervention in order to improve the following deficits and impairments:  Pain, Decreased activity tolerance, Decreased range of motion  Visit Diagnosis: Chronic right shoulder pain  Stiffness of right shoulder, not elsewhere classified     Problem List There are no active problems to display for this patient.   Samuel Duran, PTA 06/17/2018, 9:08 AM  [facility] [address] [place], [place], [address] Phone: [phone]   Fax:  [phone]  Name: Samuel Duran MRN: [id number] Date of Birth: 06-16-1970

## 2018-06-20 ENCOUNTER — Encounter: Payer: Self-pay | Admitting: Physical Therapy

## 2018-06-20 ENCOUNTER — Ambulatory Visit: Payer: 59 | Admitting: Physical Therapy

## 2018-06-20 DIAGNOSIS — G8929 Other chronic pain: Secondary | ICD-10-CM

## 2018-06-20 DIAGNOSIS — M25511 Pain in right shoulder: Principal | ICD-10-CM

## 2018-06-20 DIAGNOSIS — M25611 Stiffness of right shoulder, not elsewhere classified: Secondary | ICD-10-CM

## 2018-06-20 NOTE — Therapy (Signed)
[facility] [address] [place], [place], [address] Phone: [phone]   Fax:  [phone]  Physical Therapy Treatment  Patient Details  Name: Samuel GlennChristopher R Schmoker MRN: [id number] Date of Birth: May 31, 1970 Referring Provider (PT): Francena HanlyKevin Supple MD   Encounter Date: 06/20/2018  PT End of Session - 06/20/18 1651    Visit Number  14    Number of Visits  16    Date for PT Re-Evaluation  07/01/18    Authorization Type  FOTO; progress note every 10th visit    PT Start Time  1643    PT Stop Time  1738    PT Time Calculation (min)  55 min    Activity Tolerance  Patient tolerated treatment well    Behavior During Therapy  [facility] for tasks assessed/performed       Past Medical History:  Diagnosis Date  . Allergy   . Anxiety   . Asthma    allergy induced asthma    Past Surgical History:  Procedure Laterality Date  . CYST EXCISION     forehead  . 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  Subjective Assessment - 06/20/18 1651    Subjective  Patient reported ongoing soreness especially with ER, still compliant with HEP.    Pertinent History  Dysplasia of shoulder    Patient Stated Goals  Use arm without pain.    Currently in Pain?  Yes    Pain Score  2     Pain Location  Shoulder    Pain Orientation  Right    Pain Descriptors / Indicators  Sore    Pain Type  Surgical pain    Pain Onset  More than a month ago    Pain Frequency  Constant         [facility] PT Assessment - 06/20/18 0001      Assessment   Medical Diagnosis  Right total shoulder replacement.    Referring Provider (PT)  Francena HanlyKevin Supple MD    Onset Date/Surgical Date  04/18/18    Next MD Visit  07/15/2018                   [facility]/Exercise - 06/20/18 0001       Shoulder Exercises: Standing   Protraction  Strengthening;Right;20 reps;Theraband    Theraband Level (Shoulder Protraction)  Level 1 (Yellow)    External Rotation  Strengthening;Right;20 reps;Theraband    Theraband Level (Shoulder External Rotation)  Level 1 (Yellow)    Internal Rotation  Strengthening;Right;Theraband    Theraband Level (Shoulder Internal Rotation)  Level 1 (Yellow)    Row  Strengthening;Right;20 reps;Theraband    Theraband Level (Shoulder Row)  Level 1 (Yellow)      Shoulder Exercises: Pulleys   Flexion  5 minutes    Other Pulley Exercises  standing UE ranger cw, ccw, flexion x 9 mins (3 minutes each)      Electrical Stimulation   Electrical Stimulation Location  R shoulder    Electrical Stimulation Action  IFC    Electrical Stimulation Parameters  80-150 hz x15 mins    Electrical Stimulation Goals  Pain      Vasopneumatic   Number Minutes Vasopneumatic   15 minutes    Vasopnuematic Location   Shoulder    Vasopneumatic Pressure  Low      Manual Therapy   Manual Therapy  Passive ROM    Passive ROM  PROM of R shoulder into flexion, ER, IR with gentle holds at end range, rhythmic stabilization at 90 120 and 50 degrees flexion 15" x2 each                  PT Long Term Goals - 06/04/18 0831      PT LONG TERM GOAL #1   Title  Independent with a HEP.    Time  8    Period  Weeks    Status  On-going      PT LONG TERM GOAL #2   Title  Active right shoulder flexion to 145 degrees so the patient can easily reach overhead.    Period  Weeks    Status  On-going      PT LONG TERM GOAL #3   Title  Active ER to 70 degrees+ to allow for easily donning/doffing of apparel.    Time  8    Status  On-going      PT LONG TERM GOAL #4   Title  Increase ROM so patient is able to reach behind back to L3.    Time  8    Period  Weeks    Status  On-going      PT LONG TERM GOAL #5   Title  Increase right shoulder strength to a solid 4+/5 to increase stability for  performance of functional activities.    Time  8    Period  Weeks    Status  On-going            Plan - 06/20/18 1653    Clinical Impression Statement  Patient was able to tolerate treatment well today with progression of exercises per protocol. Patient required intermittent verbal cuing for form with new TEs but patient demonstrated good form after cuing. Patient still demonstrates limited flexion ER, and abduction ROM but is improving. Patient instructed to perform scar massage to incision to reduce scar tissue. Patient reported understanding. Normal response to modalites upon removal.     Clinical Presentation  Stable    Clinical Decision Making  Low    Rehab Potential  Excellent    Clinical Impairments Affecting Rehab Potential  surgery 04/18/18 current 9 weeks 06/20/18    PT Frequency  2x / week    PT Duration  8 weeks    PT Treatment/Interventions  ADLs/Self Care Home Management;Cryotherapy;Electrical Stimulation;Therapeutic exercise;Therapeutic activities;Neuromuscular re-education;Patient/family education;Passive range of motion;Manual techniques;Vasopneumatic Device    PT Next Visit Plan  FOTO for 15th visit; Cont with POC per protocol    Consulted and Agree with Plan of Care  Patient       Patient will benefit from skilled therapeutic intervention in order to improve the following deficits and impairments:  Pain, Decreased activity tolerance, Decreased range of motion  Visit Diagnosis: Chronic right shoulder pain  Stiffness of right shoulder, not elsewhere classified     Problem List There are no active problems to display for this patient.  Guss Bunde, PT, DPT  06/20/2018, 5:51 PM  [facility] [address] [place], [place], [address] Phone: [phone]   Fax:  [phone]  Name: MAURIE KETELHUT MRN: [id number] Date of Birth: 04/07/1971

## 2018-06-24 ENCOUNTER — Ambulatory Visit: Payer: 59 | Admitting: Physical Therapy

## 2018-06-24 ENCOUNTER — Encounter: Payer: Self-pay | Admitting: Physical Therapy

## 2018-06-24 DIAGNOSIS — M25511 Pain in right shoulder: Principal | ICD-10-CM

## 2018-06-24 DIAGNOSIS — G8929 Other chronic pain: Secondary | ICD-10-CM

## 2018-06-24 DIAGNOSIS — M25611 Stiffness of right shoulder, not elsewhere classified: Secondary | ICD-10-CM

## 2018-06-24 NOTE — Therapy (Signed)
[facility] [address] [place], [place], [address] Phone: [phone]   Fax:  [phone]  Physical Therapy Treatment  Patient Details  Name: Samuel Duran MRN: [id number] Date of Birth: July 04, 1970 Referring Provider (PT): Francena Hanly MD   Encounter Date: 06/24/2018  PT End of Session - 06/24/18 0741    Visit Number  15    Number of Visits  28   per new order 06/03/2018   Date for PT Re-Evaluation  07/01/18    Authorization Type  FOTO; progress note every 10th visit    PT Start Time  0730    PT Stop Time  0826    PT Time Calculation (min)  56 min    Activity Tolerance  Patient tolerated treatment well    Behavior During Therapy  [facility] for tasks assessed/performed       Past Medical History:  Diagnosis Date  . Allergy   . Anxiety   . Asthma    allergy induced asthma    Past Surgical History:  Procedure Laterality Date  . CYST EXCISION     forehead  . 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  Subjective Assessment - 06/24/18 0735    Subjective  Patient reported feeling stiff this morning.     Pertinent History  Dysplasia of shoulder    Patient Stated Goals  Use arm without pain.    Currently in Pain?  Yes    Pain Score  1     Pain Location  Shoulder    Pain Orientation  Right    Pain Descriptors / Indicators  Sore    Pain Type  Surgical pain    Pain Onset  More than a month ago    Pain Frequency  Constant         [facility] PT Assessment - 06/24/18 0001      Assessment   Medical Diagnosis  Right total shoulder replacement.    Referring Provider (PT)  Francena Hanly MD    Onset Date/Surgical Date  04/18/18    Next MD Visit  07/15/2018      AROM   Right Shoulder Flexion  142 Degrees    Right Shoulder Internal Rotation  55  Degrees    Right Shoulder External Rotation  53 Degrees                   [facility] Adult PT Treatment/Exercise - 06/24/18 0001      Shoulder Exercises: Pulleys   Flexion  5 minutes    Other Pulley Exercises  standing UE ranger cw, ccw, flexion x 9 mins (3 minutes each)      Shoulder Exercises: ROM/Strengthening   UBE (Upper Arm Bike)  120 RPM x6 min, 3 fwd, 3 bwd      Electrical Stimulation   Electrical Stimulation Location  R shoulder    Electrical Stimulation Action  IFC    Electrical Stimulation Parameters  80-150 hz x15 mins    Electrical Stimulation Goals  Pain      Vasopneumatic   Number Minutes Vasopneumatic   15 minutes    Vasopnuematic Location   Shoulder    Vasopneumatic Pressure  Low    Vasopneumatic Temperature   34      Manual Therapy   Manual Therapy  Passive ROM  Passive ROM  PROM of R shoulder into flexion, ER, IR with gentle holds at end range, rhythmic stabilization at 90 120 and 50 degrees flexion 15" x2 each                  PT Long Term Goals - 06/24/18 0743      PT LONG TERM GOAL #1   Title  Independent with a HEP.    Time  8    Period  Weeks    Status  On-going      PT LONG TERM GOAL #2   Title  Active right shoulder flexion to 145 degrees so the patient can easily reach overhead.    Time  8    Period  Weeks    Status  On-going      PT LONG TERM GOAL #3   Title  Active ER to 70 degrees+ to allow for easily donning/doffing of apparel.    Time  8    Period  Weeks    Status  On-going      PT LONG TERM GOAL #4   Title  Increase ROM so patient is able to reach behind back to L3.    Time  8    Period  Weeks    Status  On-going      PT LONG TERM GOAL #5   Title  Increase right shoulder strength to a solid 4+/5 to increase stability for performance of functional activities.    Time  8    Period  Weeks    Status  On-going            Plan - 06/24/18 8088    Clinical Impression Statement  Patient was able to  tolerate progression of treatment well with reports of muscle fatigue. Patient continues to have limitations in ER, AROM measured at 53 degrees. Per new order provided on 06/03/2018, 12 more visits were added to his plan of care. Goals are ongoing at this time. Normal response to modalities upon removal.     Clinical Presentation  Stable    Clinical Decision Making  Low    Rehab Potential  Excellent    Clinical Impairments Affecting Rehab Potential  surgery 04/18/18 current 9 weeks 06/20/18    PT Frequency  2x / week    PT Duration  8 weeks    PT Treatment/Interventions  ADLs/Self Care Home Management;Cryotherapy;Electrical Stimulation;Therapeutic exercise;Therapeutic activities;Neuromuscular re-education;Patient/family education;Passive range of motion;Manual techniques;Vasopneumatic Device    PT Next Visit Plan  RW4 with Red TBand assess and provide with HEP; continue with POC, manual stretching to improve ROM    Consulted and Agree with Plan of Care  Patient       Patient will benefit from skilled therapeutic intervention in order to improve the following deficits and impairments:  Pain, Decreased activity tolerance, Decreased range of motion  Visit Diagnosis: Chronic right shoulder pain  Stiffness of right shoulder, not elsewhere classified     Problem List There are no active problems to display for this patient.  Guss Bunde, PT, DPT 06/24/2018, 10:11 AM  [facility] [address] [place], [place], [address] Phone: [phone]   Fax:  [phone]  Name: Samuel Duran MRN: [id number] Date of Birth: 04/08/1971

## 2018-06-27 ENCOUNTER — Encounter: Payer: Self-pay | Admitting: Physical Therapy

## 2018-06-27 ENCOUNTER — Ambulatory Visit: Payer: 59 | Admitting: Physical Therapy

## 2018-06-27 DIAGNOSIS — M25511 Pain in right shoulder: Secondary | ICD-10-CM | POA: Diagnosis not present

## 2018-06-27 DIAGNOSIS — M25611 Stiffness of right shoulder, not elsewhere classified: Secondary | ICD-10-CM

## 2018-06-27 DIAGNOSIS — G8929 Other chronic pain: Secondary | ICD-10-CM

## 2018-06-27 NOTE — Therapy (Signed)
[facility] [address] [place], [place], [address] Phone: [phone]   Fax:  [phone]  Physical Therapy Treatment  Patient Details  Name: Samuel Duran MRN: [id number] Date of Birth: 1970-10-13 Referring Provider (PT): Francena Hanly MD   Encounter Date: 06/27/2018  PT End of Session - 06/27/18 1745    Visit Number  16    Number of Visits  28    Date for PT Re-Evaluation  07/01/18    Authorization Type  FOTO; progress note every 10th visit    PT Start Time  1645    PT Stop Time  1743    PT Time Calculation (min)  58 min    Activity Tolerance  Patient tolerated treatment well    Behavior During Therapy  [facility] for tasks assessed/performed       Past Medical History:  Diagnosis Date  . Allergy   . Anxiety   . Asthma    allergy induced asthma    Past Surgical History:  Procedure Laterality Date  . CYST EXCISION     forehead  . 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      [facility] PT Assessment - 06/27/18 0001      Assessment   Medical Diagnosis  Right total shoulder replacement.    Referring Provider (PT)  Francena Hanly MD    Onset Date/Surgical Date  04/18/18    Next MD Visit  07/15/2018                   [facility] Adult PT Treatment/Exercise - 06/27/18 0001      Shoulder Exercises: Prone   Retraction  AROM;Right;20 reps;10 reps    Extension  Strengthening;Right;20 reps;10 reps      Shoulder Exercises: Standing   Protraction  Strengthening;Right;20 reps;Theraband    Theraband Level (Shoulder Protraction)  Level 2 (Red)    External Rotation  Strengthening;Right;20 reps;Theraband    Theraband Level (Shoulder External Rotation)  Level 2 (Red)    Internal Rotation  Strengthening;Right;Theraband    Theraband Level  (Shoulder Internal Rotation)  Level 2 (Red)    Row  Strengthening;Right;20 reps;Theraband    Theraband Level (Shoulder Row)  Level 2 (Red)      Shoulder Exercises: Pulleys   Flexion  5 minutes    Other Pulley Exercises  standing UE ranger cw, ccw, flexion x 9 mins (3 minutes each)      Shoulder Exercises: ROM/Strengthening   UBE (Upper Arm Bike)  120 RPM x6 min, 3 fwd, 3 bwd      Electrical Stimulation   Electrical Stimulation Location  R shoulder    Electrical Stimulation Action  IFC    Electrical Stimulation Parameters  80-150 hz x15 mins    Electrical Stimulation Goals  Pain      Vasopneumatic   Number Minutes Vasopneumatic   15 minutes    Vasopnuematic Location   Shoulder    Vasopneumatic Pressure  Low    Vasopneumatic Temperature   34                  PT Long Term Goals - 06/24/18 0743      PT LONG TERM GOAL #1   Title  Independent with a HEP.    Time  8    Period  Weeks  Status  On-going      PT LONG TERM GOAL #2   Title  Active right shoulder flexion to 145 degrees so the patient can easily reach overhead.    Time  8    Period  Weeks    Status  On-going      PT LONG TERM GOAL #3   Title  Active ER to 70 degrees+ to allow for easily donning/doffing of apparel.    Time  8    Period  Weeks    Status  On-going      PT LONG TERM GOAL #4   Title  Increase ROM so patient is able to reach behind back to L3.    Time  8    Period  Weeks    Status  On-going      PT LONG TERM GOAL #5   Title  Increase right shoulder strength to a solid 4+/5 to increase stability for performance of functional activities.    Time  8    Period  Weeks    Status  On-going            Plan - 06/27/18 1733    Clinical Impression Statement  Patient was able to tolerate progression of treatment well and noted with good challenge with red theraband rw4. Patient continues to have functional IR deficits and patient instructed on ways to improve ROM with strap as well as  self release with tennis ball. Patient reported understanding. Normal response to modalities upon removal.     Clinical Presentation  Stable    Clinical Decision Making  Low    Rehab Potential  Excellent    Clinical Impairments Affecting Rehab Potential  surgery 04/18/18 current 9 weeks 06/20/18    PT Frequency  2x / week    PT Duration  8 weeks    PT Treatment/Interventions  ADLs/Self Care Home Management;Cryotherapy;Electrical Stimulation;Therapeutic exercise;Therapeutic activities;Neuromuscular re-education;Patient/family education;Passive range of motion;Manual techniques;Vasopneumatic Device    PT Next Visit Plan  RW4 with Red TBand assess and provide with HEP; continue with POC, manual stretching to improve ROM    Consulted and Agree with Plan of Care  Patient       Patient will benefit from skilled therapeutic intervention in order to improve the following deficits and impairments:  Pain, Decreased activity tolerance, Decreased range of motion  Visit Diagnosis: Chronic right shoulder pain  Stiffness of right shoulder, not elsewhere classified     Problem List There are no active problems to display for this patient.   Samuel Duran, PT, DPT 06/27/2018, 5:46 PM  [facility] [address] [place], [place], [address] Phone: [phone]   Fax:  [phone]  Name: Samuel Duran MRN: [id number] Date of Birth: [birth year]

## 2018-07-01 ENCOUNTER — Encounter: Payer: Self-pay | Admitting: Physical Therapy

## 2018-07-01 ENCOUNTER — Ambulatory Visit: Payer: 59 | Attending: Orthopedic Surgery | Admitting: Physical Therapy

## 2018-07-01 DIAGNOSIS — G8929 Other chronic pain: Secondary | ICD-10-CM | POA: Diagnosis not present

## 2018-07-01 DIAGNOSIS — M25511 Pain in right shoulder: Secondary | ICD-10-CM | POA: Diagnosis present

## 2018-07-01 DIAGNOSIS — M25611 Stiffness of right shoulder, not elsewhere classified: Secondary | ICD-10-CM | POA: Diagnosis not present

## 2018-07-01 NOTE — Therapy (Signed)
[facility] [address] [place], [place], [address] Phone: [phone]   Fax:  [phone]  Physical Therapy Treatment  Patient Details  Name: Samuel Duran MRN: [id number] Date of Birth: 10-Jan-1971 Referring Provider (PT): Francena Hanly MD   Encounter Date: 07/01/2018  PT End of Session - 07/01/18 0812    Visit Number  17    Number of Visits  28    Date for PT Re-Evaluation  07/01/18    Authorization Type  FOTO; progress note every 10th visit    PT Start Time  0731    PT Stop Time  0822    PT Time Calculation (min)  51 min    Activity Tolerance  Patient tolerated treatment well    Behavior During Therapy  [facility] for tasks assessed/performed       Past Medical History:  Diagnosis Date  . Allergy   . Anxiety   . Asthma    allergy induced asthma    Past Surgical History:  Procedure Laterality Date  . CYST EXCISION     forehead  . 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  Subjective Assessment - 07/01/18 0732    Subjective  Patient reported shoulder is good but stiff this morning.    Pertinent History  Dysplasia of shoulder    Patient Stated Goals  Use arm without pain.    Currently in Pain?  Yes    Pain Score  1     Pain Location  Shoulder    Pain Orientation  Right    Pain Descriptors / Indicators  Sore    Pain Type  Surgical pain    Pain Onset  More than a month ago    Pain Frequency  Constant         [facility] PT Assessment - 07/01/18 0001      Assessment   Medical Diagnosis  Right total shoulder replacement.    Referring Provider (PT)  Francena Hanly MD    Onset Date/Surgical Date  04/18/18    Next MD Visit  07/15/2018                   [facility] Adult PT Treatment/Exercise - 07/01/18 0001      Shoulder Exercises:  Standing   Protraction  Strengthening;Right;20 reps;Theraband    Theraband Level (Shoulder Protraction)  Level 2 (Red)    External Rotation  Strengthening;Right;20 reps;Theraband    Internal Rotation  Strengthening;Right;Theraband    Theraband Level (Shoulder Internal Rotation)  Level 2 (Red)    Flexion  Strengthening;Right;20 reps;Weights   and scaption   Shoulder Flexion Weight (lbs)  2    ABduction  AROM;10 reps   against wall   Row  Strengthening;Right;20 reps;Theraband    Theraband Level (Shoulder Row)  Level 2 (Red)      Shoulder Exercises: Pulleys   Flexion  5 minutes      Shoulder Exercises: ROM/Strengthening   UBE (Upper Arm Bike)  --    X to V Arms  standing x10    Other ROM/Strengthening Exercises  wall ladder x10 to level 32      Electrical Stimulation   Electrical Stimulation Location  R shoulder    Electrical Stimulation Action  IFC    Electrical Stimulation Parameters  80-150 hz x15 mins  Electrical Stimulation Goals  Pain      Vasopneumatic   Number Minutes Vasopneumatic   15 minutes    Vasopnuematic Location   Shoulder    Vasopneumatic Pressure  Low    Vasopneumatic Temperature   48      Manual Therapy   Manual Therapy  Passive ROM    Passive ROM  PROM of R shoulder into flexion, ER, IR with gentle holds at end range, PROM in D2 PNF pattern,  rhythmic stabilization at 90 120 and 50 degrees flexion 15" x2 each                  PT Long Term Goals - 06/24/18 0743      PT LONG TERM GOAL #1   Title  Independent with a HEP.    Time  8    Period  Weeks    Status  On-going      PT LONG TERM GOAL #2   Title  Active right shoulder flexion to 145 degrees so the patient can easily reach overhead.    Time  8    Period  Weeks    Status  On-going      PT LONG TERM GOAL #3   Title  Active ER to 70 degrees+ to allow for easily donning/doffing of apparel.    Time  8    Period  Weeks    Status  On-going      PT LONG TERM GOAL #4   Title   Increase ROM so patient is able to reach behind back to L3.    Time  8    Period  Weeks    Status  On-going      PT LONG TERM GOAL #5   Title  Increase right shoulder strength to a solid 4+/5 to increase stability for performance of functional activities.    Time  8    Period  Weeks    Status  On-going            Plan - 07/01/18 0813    Clinical Impression Statement  Patient was able to tolerate progression of treatment well. Patient required intermittent cuing to slow movement to prevent shoulder elevation compensation and use of momentum for flexion and abduction weighted exercises. Patient continues to demonstrate stiffness with PROM ER as well as abduction. Normal response to modalities upon removal.     Clinical Presentation  Stable    Clinical Decision Making  Low    Rehab Potential  Excellent    Clinical Impairments Affecting Rehab Potential  surgery 04/18/18 currently 10 weeks 06/27/18    PT Frequency  2x / week    PT Duration  8 weeks    PT Treatment/Interventions  ADLs/Self Care Home Management;Cryotherapy;Electrical Stimulation;Therapeutic exercise;Therapeutic activities;Neuromuscular re-education;Patient/family education;Passive range of motion;Manual techniques;Vasopneumatic Device    PT Next Visit Plan  Continue with strengthening and AROM; modalities PRN    Consulted and Agree with Plan of Care  Patient       Patient will benefit from skilled therapeutic intervention in order to improve the following deficits and impairments:  Pain, Decreased activity tolerance, Decreased range of motion  Visit Diagnosis: Chronic right shoulder pain  Stiffness of right shoulder, not elsewhere classified     Problem List There are no active problems to display for this patient.  Guss BundeKrystle Anika Shore, PT, DPT 07/01/2018, 10:52 AM  [facility] [address] [place], [place], [address] Phone: [phone]   Fax:  [phone]  Name:  Samuel Duran MRN: [id number] Date of Birth: 1971-05-27

## 2018-07-03 DIAGNOSIS — T63451D Toxic effect of venom of hornets, accidental (unintentional), subsequent encounter: Secondary | ICD-10-CM | POA: Diagnosis not present

## 2018-07-04 ENCOUNTER — Ambulatory Visit: Payer: 59 | Admitting: Physical Therapy

## 2018-07-04 ENCOUNTER — Encounter: Payer: Self-pay | Admitting: Physical Therapy

## 2018-07-04 DIAGNOSIS — G8929 Other chronic pain: Secondary | ICD-10-CM

## 2018-07-04 DIAGNOSIS — M25611 Stiffness of right shoulder, not elsewhere classified: Secondary | ICD-10-CM

## 2018-07-04 DIAGNOSIS — M25511 Pain in right shoulder: Secondary | ICD-10-CM | POA: Diagnosis not present

## 2018-07-04 NOTE — Therapy (Signed)
[facility] [address] [place], [place], [address] Phone: [phone]   Fax:  [phone]  Physical Therapy Treatment  Patient Details  Name: Samuel GlennChristopher R Goggins MRN: [id number] Date of Birth: [birth year] Referring Provider (PT): Francena HanlyKevin Supple MD   Encounter Date: 07/04/2018  PT End of Session - 07/04/18 1522    Visit Number  18    Number of Visits  28    Date for PT Re-Evaluation  07/01/18    Authorization Type  FOTO; progress note every 10th visit    PT Start Time  1515    PT Stop Time  1614    PT Time Calculation (min)  59 min    Activity Tolerance  Patient tolerated treatment well    Behavior During Therapy  [facility] for tasks assessed/performed       Past Medical History:  Diagnosis Date  . Allergy   . Anxiety   . Asthma    allergy induced asthma    Past Surgical History:  Procedure Laterality Date  . CYST EXCISION     forehead  . 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  Subjective Assessment - 07/04/18 1521    Subjective  Patient reported right shoulder pain is about a 1/10 today.    Pertinent History  Dysplasia of shoulder    Patient Stated Goals  Use arm without pain.    Currently in Pain?  Yes    Pain Score  1     Pain Location  Shoulder    Pain Orientation  Right    Pain Descriptors / Indicators  Sore    Pain Type  Surgical pain    Pain Onset  More than a month ago         [facility] - 07/04/18 0001      Assessment   Medical Diagnosis  Right total shoulder replacement.    Referring Provider (PT)  Francena HanlyKevin Supple MD    Onset Date/Surgical Date  04/18/18    Next MD Visit  07/15/2018                   [facility] - 07/04/18 0001      Shoulder Exercises: Standing   Protraction   Strengthening;Right;20 reps;10 reps;Theraband    Theraband Level (Shoulder Protraction)  Level 2 (Red)    External Rotation  Strengthening;Right;20 reps;Theraband    Theraband Level (Shoulder External Rotation)  Level 2 (Red)    Internal Rotation  Strengthening;Right;10 reps;20 reps;Theraband    Theraband Level (Shoulder Internal Rotation)  Level 2 (Red)    Row  Strengthening;Right;20 reps;10 reps;Theraband    Theraband Level (Shoulder Row)  Level 2 (Red)      Shoulder Exercises: Pulleys   Flexion  5 minutes    Other Pulley Exercises  internal rotation 30" x3      Shoulder Exercises: ROM/Strengthening   UBE (Upper Arm Bike)  120 RPM x8 min 4 fwd, 4 bwd    X to V Arms  standing x10      Shoulder Exercises: Stretch   Table Stretch - Flexion  3 reps;30 seconds    Table Stretch - External Rotation  3 reps;30 seconds      Electrical Stimulation   Electrical Stimulation Location  R shoulder    Electrical Stimulation  Action  IFC    Electrical Stimulation Parameters  80-150 hz x15 mins    Electrical Stimulation Goals  Pain      Vasopneumatic   Number Minutes Vasopneumatic   15 minutes    Vasopnuematic Location   Shoulder    Vasopneumatic Pressure  Low    Vasopneumatic Temperature   48      Manual Therapy   Manual Therapy  --    Passive ROM  --                  PT Long Term Goals - 06/24/18 0743      PT LONG TERM GOAL #1   Title  Independent with a HEP.    Time  8    Period  Weeks    Status  On-going      PT LONG TERM GOAL #2   Title  Active right shoulder flexion to 145 degrees so the patient can easily reach overhead.    Time  8    Period  Weeks    Status  On-going      PT LONG TERM GOAL #3   Title  Active ER to 70 degrees+ to allow for easily donning/doffing of apparel.    Time  8    Period  Weeks    Status  On-going      PT LONG TERM GOAL #4   Title  Increase ROM so patient is able to reach behind back to L3.    Time  8    Period  Weeks    Status   On-going      PT LONG TERM GOAL #5   Title  Increase right shoulder strength to a solid 4+/5 to increase stability for performance of functional activities.    Time  8    Period  Weeks    Status  On-going            Plan - 07/04/18 1615    Clinical Impression Statement  Patient was able to tolerate progression of treatment well. Patient provided with new stretches at home to improve flexion AROM as well as ER AROM and functional behind the back IR. Patient required verbal cues to prevent leaning and for proper posture and was able to demonstrate with improved form after cue. No adverse affects noted upon removal of modalities.     Clinical Presentation  Stable    Clinical Decision Making  Low    Rehab Potential  Excellent    Clinical Impairments Affecting Rehab Potential  surgery 04/18/18 currently 10 weeks 06/27/18    PT Frequency  2x / week    PT Duration  8 weeks    PT Treatment/Interventions  ADLs/Self Care Home Management;Cryotherapy;Electrical Stimulation;Therapeutic exercise;Therapeutic activities;Neuromuscular re-education;Patient/family education;Passive range of motion;Manual techniques;Vasopneumatic Device    PT Next Visit Plan  Continue with strengthening and AROM; modalities PRN    Consulted and Agree with Plan of Care  Patient       Patient will benefit from skilled therapeutic intervention in order to improve the following deficits and impairments:  Pain, Decreased activity tolerance, Decreased range of motion  Visit Diagnosis: Chronic right shoulder pain  Stiffness of right shoulder, not elsewhere classified     Problem List There are no active problems to display for this patient.  Guss Bunde, PT, DPT 07/04/2018, 4:20 PM  [facility] [address] [place], [place], [address] Phone: [phone]   Fax:  [phone]  Name: Rayon  Francina AmesR Vigorito MRN: [id number] Date of Birth: 1970-11-10

## 2018-07-08 ENCOUNTER — Ambulatory Visit: Payer: 59 | Admitting: Physical Therapy

## 2018-07-08 ENCOUNTER — Encounter: Payer: Self-pay | Admitting: Physical Therapy

## 2018-07-08 DIAGNOSIS — M25511 Pain in right shoulder: Principal | ICD-10-CM

## 2018-07-08 DIAGNOSIS — M25611 Stiffness of right shoulder, not elsewhere classified: Secondary | ICD-10-CM

## 2018-07-08 DIAGNOSIS — G8929 Other chronic pain: Secondary | ICD-10-CM

## 2018-07-08 NOTE — Therapy (Signed)
[facility] [address] [place], [place], [address] Phone: [phone]   Fax:  [phone]  Physical Therapy Treatment  Patient Details  Name: DEMARRIO BONADIO MRN: [id number] Date of Birth: [birth year] Referring Provider (PT): Francena Hanly MD   Encounter Date: 07/08/2018  PT End of Session - 07/08/18 0732    Visit Number  19    Number of Visits  28    Date for PT Re-Evaluation  07/01/18    Authorization Type  FOTO; progress note every 10th visit    PT Start Time  0732    PT Stop Time  0823    PT Time Calculation (min)  51 min    Activity Tolerance  Patient tolerated treatment well    Behavior During Therapy  [facility] for tasks assessed/performed       Past Medical History:  Diagnosis Date  . Allergy   . Anxiety   . Asthma    allergy induced asthma    Past Surgical History:  Procedure Laterality Date  . CYST EXCISION     forehead  . 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  Subjective Assessment - 07/08/18 0732    Subjective  Patient reported feeling good, just morning stiffness.    Pertinent History  Dysplasia of shoulder    Patient Stated Goals  Use arm without pain.    Currently in Pain?  No/denies         [facility] PT Assessment - 07/08/18 0001      Assessment   Medical Diagnosis  Right total shoulder replacement.    Referring Provider (PT)  Francena Hanly MD    Onset Date/Surgical Date  04/18/18    Next MD Visit  07/15/2018                   [facility] Adult PT Treatment/Exercise - 07/08/18 0001      Shoulder Exercises: Prone   Retraction  AROM;Right;20 reps    Extension  AROM;Strengthening;Right;20 reps    Horizontal ABduction 2  AROM;Right;20 reps   Y     Shoulder Exercises: Standing   Protraction   Strengthening;Right;20 reps;10 reps;Theraband    Theraband Level (Shoulder Protraction)  Level 2 (Red)    External Rotation  Strengthening;Right;20 reps;Theraband    Theraband Level (Shoulder External Rotation)  Level 2 (Red)    Internal Rotation  Strengthening;Right;10 reps;20 reps;Theraband    Theraband Level (Shoulder Internal Rotation)  Level 2 (Red)    Extension  Strengthening;Right;20 reps;Theraband    Theraband Level (Shoulder Extension)  Level 2 (Red)    Row  Strengthening;Right;20 reps;10 reps;Theraband    Theraband Level (Shoulder Row)  Level 2 (Red)      Shoulder Exercises: Pulleys   Flexion  5 minutes      Shoulder Exercises: ROM/Strengthening   UBE (Upper Arm Bike)  90 RPM x8 min 4 fwd, 4 bwd    X to V Arms  standing x15      Electrical Stimulation   Electrical Stimulation Location  R shoulder    Electrical Stimulation Action  IFC    Electrical Stimulation Parameters  80-150 hz x15 mins    Electrical Stimulation Goals  Pain      Vasopneumatic   Number Minutes Vasopneumatic   15 minutes    Vasopnuematic Location  Shoulder    Vasopneumatic Pressure  Low    Vasopneumatic Temperature   48      Manual Therapy   Manual Therapy  Passive ROM;Joint mobilization    Joint Mobilization  inferior and posterior right shoulder joint mobs Grade 2-3 to improve ROM    Passive ROM  PROM of R shoulder into flexion, ER, IR with gentle holds at end range, PROM in D2 PNF pattern,  rhythmic stabilization at 90 abd for IR/ER 2x15"; PNF D2 flexion and D1 Flexion pattern                  PT Long Term Goals - 06/24/18 0743      PT LONG TERM GOAL #1   Title  Independent with a HEP.    Time  8    Period  Weeks    Status  On-going      PT LONG TERM GOAL #2   Title  Active right shoulder flexion to 145 degrees so the patient can easily reach overhead.    Time  8    Period  Weeks    Status  On-going      PT LONG TERM GOAL #3   Title  Active ER to 70 degrees+ to allow for  easily donning/doffing of apparel.    Time  8    Period  Weeks    Status  On-going      PT LONG TERM GOAL #4   Title  Increase ROM so patient is able to reach behind back to L3.    Time  8    Period  Weeks    Status  On-going      PT LONG TERM GOAL #5   Title  Increase right shoulder strength to a solid 4+/5 to increase stability for performance of functional activities.    Time  8    Period  Weeks    Status  On-going            Plan - 07/08/18 [id number]    Clinical Impression Statement  Patient was able to tolerate treatment well with progression of UBE RPM. Patient noted with good form and technique with all exercises but continues to have limited AROM with X to Y  exercises. Patient noted with good PROM with D2 flexion therefore AROM X to Y may possibly be due to decreased strength. Normal response to modalities upon removal.     Clinical Presentation  Stable    Clinical Decision Making  Low    Rehab Potential  Excellent    Clinical Impairments Affecting Rehab Potential  surgery 04/18/18 currently 10 weeks 06/27/18    PT Frequency  2x / week    PT Duration  8 weeks    PT Treatment/Interventions  ADLs/Self Care Home Management;Cryotherapy;Electrical Stimulation;Therapeutic exercise;Therapeutic activities;Neuromuscular re-education;Patient/family education;Passive range of motion;Manual techniques;Vasopneumatic Device    PT Next Visit Plan  Continue with strengthening and AROM; modalities PRN    PT Home Exercise Plan  flexion table stretch, IR stretch with pulleys, ER table stretch    Consulted and Agree with Plan of Care  Patient       Patient will benefit from skilled therapeutic intervention in order to improve the following deficits and impairments:  Pain, Decreased activity tolerance, Decreased range of motion  Visit Diagnosis: Chronic right shoulder pain  Stiffness of right shoulder, not elsewhere classified     Problem List There are no active problems to display  for this patient.  Guss Bunde, PT, DPT 07/08/2018, 9:10 AM  [facility] [address] [place], [place], [address] Phone: [phone]   Fax:  [phone]  Name: JAXSTYN STETTER MRN: [id number] Date of Birth: [birth year]

## 2018-07-11 ENCOUNTER — Ambulatory Visit: Payer: 59 | Admitting: Physical Therapy

## 2018-07-11 ENCOUNTER — Encounter: Payer: Self-pay | Admitting: Physical Therapy

## 2018-07-11 DIAGNOSIS — G8929 Other chronic pain: Secondary | ICD-10-CM

## 2018-07-11 DIAGNOSIS — M25511 Pain in right shoulder: Principal | ICD-10-CM

## 2018-07-11 DIAGNOSIS — M25611 Stiffness of right shoulder, not elsewhere classified: Secondary | ICD-10-CM

## 2018-07-11 NOTE — Therapy (Signed)
[facility] [address] [place], [place], [address] Phone: [phone]   Fax:  [phone]  Physical Therapy Treatment  Patient Details  Name: Samuel Duran MRN: [id number] Date of Birth: 1970-10-04 Referring Provider (PT): Francena Hanly MD   Encounter Date: 07/11/2018  PT End of Session - 07/11/18 1647    Visit Number  20    Number of Visits  28    Date for PT Re-Evaluation  07/01/18    Authorization Type  FOTO; progress note every 10th visit    PT Start Time  1644    PT Stop Time  1730    PT Time Calculation (min)  46 min    Activity Tolerance  Patient tolerated treatment well    Behavior During Therapy  [facility] for tasks assessed/performed       Past Medical History:  Diagnosis Date  . Allergy   . Anxiety   . Asthma    allergy induced asthma    Past Surgical History:  Procedure Laterality Date  . CYST EXCISION     forehead  . 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  Subjective Assessment - 07/11/18 1647    Subjective  Patient reports waking with more discomfort in the morning.    Pertinent History  Dysplasia of shoulder    Patient Stated Goals  Use arm without pain.    Currently in Pain?  No/denies         [facility] PT Assessment - 07/11/18 0001      Assessment   Medical Diagnosis  Right total shoulder replacement.    Referring Provider (PT)  Francena Hanly MD    Onset Date/Surgical Date  04/18/18    Next MD Visit  07/15/2018                   [facility] Adult PT Treatment/Exercise - 07/11/18 0001      Shoulder Exercises: Supine   Diagonals  AROM;Right;20 reps      Shoulder Exercises: Sidelying   External Rotation  AROM;Right;20 reps;Weights    External Rotation Weight (lbs)  2    Flexion  AROM;Right;20 reps;10 reps       Shoulder Exercises: Standing   Protraction  Strengthening;Right;20 reps;Theraband    Theraband Level (Shoulder Protraction)  Level 3 (Green)    External Rotation  Strengthening;Right;20 reps;Theraband    Theraband Level (Shoulder External Rotation)  Level 3 (Green)    Internal Rotation  Strengthening;Right;20 reps;Theraband    Theraband Level (Shoulder Internal Rotation)  Level 3 (Green)    Flexion  Strengthening;Right;20 reps;Weights    Shoulder Flexion Weight (lbs)  2    ABduction  Strengthening;Right;20 reps;Weights    Shoulder ABduction Weight (lbs)  2    Extension  Strengthening;Right;20 reps;Theraband    Theraband Level (Shoulder Extension)  Level 3 (Green)    Row  Strengthening;Right;20 reps;10 reps;Theraband    Theraband Level (Shoulder Row)  Level 3 (Green)    Other Standing Exercises  RUE wall slides into flexion x15 reps    Other Standing Exercises  R shoulder scaption 2# x20 reps      Shoulder Exercises: ROM/Strengthening   UBE (Upper Arm Bike)  60 RPM x8 min    Wall Pushups  20 reps    Ball on Wall  RUE CW and CCW  circles x30 reps each      Shoulder Exercises: Lawyertretch   Corner Stretch  3 reps;30 seconds      Modalities   Modalities  Programmer, applicationslectrical Stimulation;Vasopneumatic      Electrical Stimulation   Electrical Stimulation Location  R shoulder    Electrical Stimulation Action  IFC    Electrical Stimulation Parameters  80-150 hz x15 min    Electrical Stimulation Goals  Pain      Vasopneumatic   Number Minutes Vasopneumatic   15 minutes    Vasopnuematic Location   Shoulder    Vasopneumatic Pressure  Low    Vasopneumatic Temperature   71                  PT Long Term Goals - 06/24/18 0743      PT LONG TERM GOAL #1   Title  Independent with a HEP.    Time  8    Period  Weeks    Status  On-going      PT LONG TERM GOAL #2   Title  Active right shoulder flexion to 145 degrees so the patient can easily reach overhead.    Time  8    Period   Weeks    Status  On-going      PT LONG TERM GOAL #3   Title  Active ER to 70 degrees+ to allow for easily donning/doffing of apparel.    Time  8    Period  Weeks    Status  On-going      PT LONG TERM GOAL #4   Title  Increase ROM so patient is able to reach behind back to L3.    Time  8    Period  Weeks    Status  On-going      PT LONG TERM GOAL #5   Title  Increase right shoulder strength to a solid 4+/5 to increase stability for performance of functional activities.    Time  8    Period  Weeks    Status  On-going            Plan - 07/11/18 1734    Clinical Impression Statement  Patient presented in clinic with reports of discomfort upon waking. No pain upon arriving in clinic. Patient guided through resisted exercises with only complaints of fatigue during exercises. Corner stretch implemented secondary to chest tightness reported by patient. Normal modalities response noted following removal of the modalities.    Rehab Potential  Excellent    Clinical Impairments Affecting Rehab Potential  surgery 04/18/18 currently 10 weeks 06/27/18    PT Frequency  2x / week    PT Duration  8 weeks    PT Treatment/Interventions  ADLs/Self Care Home Management;Cryotherapy;Electrical Stimulation;Therapeutic exercise;Therapeutic activities;Neuromuscular re-education;Patient/family education;Passive range of motion;Manual techniques;Vasopneumatic Device    PT Next Visit Plan  Continue with strengthening and AROM; modalities PRN    PT Home Exercise Plan  flexion table stretch, IR stretch with pulleys, ER table stretch    Consulted and Agree with Plan of Care  Patient       Patient will benefit from skilled therapeutic intervention in order to improve the following deficits and impairments:  Pain, Decreased activity tolerance, Decreased range of motion  Visit Diagnosis: Chronic right shoulder pain  Stiffness of right shoulder, not elsewhere classified     Problem List There are no  active problems to display for this patient.   Marvell FullerKelsey P Ezechiel Stooksbury, PTA 07/11/2018, 5:48 PM  [facility] [address] [place], [place], [address] Phone: [phone]   Fax:  [phone]  Name: Tarri GlennChristopher R Ayala MRN: [id number] Date of Birth: [birth year]  Progress Note Reporting Period 07/07/18 to 07/11/18  See note below for Objective Data and Assessment of Progress/Goals. Patient making excellent progress per protocol.  Expected to meet LTG's.    Italyhad Applegate MPT

## 2018-07-15 ENCOUNTER — Encounter: Payer: Self-pay | Admitting: Physical Therapy

## 2018-07-15 ENCOUNTER — Ambulatory Visit: Payer: 59 | Admitting: Physical Therapy

## 2018-07-15 DIAGNOSIS — M25611 Stiffness of right shoulder, not elsewhere classified: Secondary | ICD-10-CM

## 2018-07-15 DIAGNOSIS — G8929 Other chronic pain: Secondary | ICD-10-CM

## 2018-07-15 DIAGNOSIS — Z471 Aftercare following joint replacement surgery: Secondary | ICD-10-CM | POA: Diagnosis not present

## 2018-07-15 DIAGNOSIS — M25511 Pain in right shoulder: Secondary | ICD-10-CM | POA: Diagnosis not present

## 2018-07-15 DIAGNOSIS — Z96611 Presence of right artificial shoulder joint: Secondary | ICD-10-CM | POA: Diagnosis not present

## 2018-07-15 NOTE — Therapy (Signed)
[facility] [place], [place], [address] Phone: [phone]   Fax:  [phone]  Physical Therapy Treatment  Progress Note Reporting Period 06/13/2018 to 07/15/2018  See note below for Objective Data and Assessment of Progress/Goals.     Patient Details  Name: Samuel Duran MRN: [id number] Date of Birth: Feb 03, 1971 Referring Provider (PT): Justice Britain MD   Encounter Date: 07/15/2018  PT End of Session - 07/15/18 0807    Visit Number  21    Number of Visits  28    Date for PT Re-Evaluation  07/01/18    Authorization Type  FOTO; progress note every 10th visit    PT Start Time  0730    PT Stop Time  0826    PT Time Calculation (min)  56 min    Activity Tolerance  Patient tolerated treatment well    Behavior During Therapy  [facility] for tasks assessed/performed       Past Medical History:  Diagnosis Date  . Allergy   . Anxiety   . Asthma    allergy induced asthma    Past Surgical History:  Procedure Laterality Date  . CYST EXCISION     forehead  . 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  Subjective Assessment - 07/15/18 0731    Subjective  Reports feeling stiff as usual in the morning but feels like early PT helps his shoulder throughout the day.    Pertinent History  Dysplasia of shoulder    Patient Stated Goals  Use arm without pain.    Currently in Pain?  No/denies         [facility] PT Assessment - 07/15/18 0001      Assessment   Medical Diagnosis  Right total shoulder replacement.    Referring Provider (PT)  Justice Britain MD    Onset Date/Surgical Date  04/18/18    Next MD Visit  07/15/2018      ROM / Strength   AROM / PROM / Strength  PROM;Strength;AROM      AROM   Right Shoulder Flexion  153 Degrees    Right  Shoulder ABduction  142 Degrees    Right Shoulder External Rotation  62 Degrees      PROM   Right Shoulder Flexion  152 Degrees    Right Shoulder Internal Rotation  53 Degrees    Right Shoulder External Rotation  64 Degrees      Strength   Strength Assessment Site  Shoulder    Right/Left Shoulder  Right    Right Shoulder Flexion  3+/5    Right Shoulder ABduction  3+/5    Right Shoulder Internal Rotation  4/5    Right Shoulder External Rotation  4/5                   [facility] Adult PT Treatment/Exercise - 07/15/18 0001      Shoulder Exercises: Standing   Protraction  Strengthening;Right;20 reps;Theraband    Theraband Level (Shoulder Protraction)  Level 3 (Green)    External Rotation  Strengthening;Right;20 reps;Theraband    Theraband Level (Shoulder External Rotation)  Level 3 (Green)    Internal Rotation  Strengthening;Right;20 reps;Theraband    Theraband Level (Shoulder Internal Rotation)  Level 3 (Green)    Flexion  Strengthening;Right;20 reps;Weights  Shoulder Flexion Weight (lbs)  2    ABduction  Strengthening;Right;20 reps;Weights    Shoulder ABduction Weight (lbs)  2    Row  Strengthening;Right;20 reps;10 reps;Theraband    Theraband Level (Shoulder Row)  Level 3 (Green)    Other Standing Exercises  R shoulder scaption 2# x20 reps      Shoulder Exercises: ROM/Strengthening   UBE (Upper Arm Bike)  60 RPM x8 min      Electrical Stimulation   Electrical Stimulation Location  R shoulder    Electrical Stimulation Action  IFC    Electrical Stimulation Parameters  80-150 hz x15 mins    Electrical Stimulation Goals  Pain      Vasopneumatic   Number Minutes Vasopneumatic   15 minutes    Vasopnuematic Location   Shoulder    Vasopneumatic Pressure  Low    Vasopneumatic Temperature   46      Manual Therapy   Manual Therapy  Passive ROM;Joint mobilization    Passive ROM  PROM of R shoulder into flexion, ER, IR with gentle holds at end range, PROM in D2 PNF  pattern,  rhythmic stabilization at 90 abd for IR/ER 2x15"; PNF D2 flexion and D1 Flexion pattern with resistance and quick streetch                  PT Long Term Goals - 07/15/18 0900      PT LONG TERM GOAL #1   Title  Independent with a HEP.    Time  8    Period  Weeks    Status  Achieved      PT LONG TERM GOAL #2   Title  Active right shoulder flexion to 145 degrees so the patient can easily reach overhead.    Time  8    Period  Weeks    Status  Achieved      PT LONG TERM GOAL #3   Title  Active ER to 70 degrees+ to allow for easily donning/doffing of apparel.    Time  8    Period  Weeks    Status  On-going      PT LONG TERM GOAL #4   Title  Increase ROM so patient is able to reach behind back to L3.    Time  8    Period  Weeks    Status  On-going      PT LONG TERM GOAL #5   Title  Increase right shoulder strength to a solid 4+/5 to increase stability for performance of functional activities.    Time  8    Period  Weeks    Status  On-going            Plan - 07/15/18 0815    Clinical Impression Statement  Patient was able to tolerate treatment well with minimal reports of increased pain. Patient noted with excellent form with all resisted theraband exercises. Patient AROM and PROM has improved see objective measures. Goals have been met but still ongoing for strength. Normal response to modalities upon removal.    Clinical Presentation  Stable    Clinical Decision Making  Low    Rehab Potential  Excellent    Clinical Impairments Affecting Rehab Potential  surgery 04/18/18 currently 10 weeks 06/27/18    PT Frequency  2x / week    PT Duration  8 weeks    PT Treatment/Interventions  ADLs/Self Care Home Management;Cryotherapy;Electrical Stimulation;Therapeutic exercise;Therapeutic activities;Neuromuscular re-education;Patient/family education;Passive range of motion;Manual techniques;Vasopneumatic Device  PT Next Visit Plan  Continue with strengthening  and AROM; modalities PRN    Consulted and Agree with Plan of Care  Patient       Patient will benefit from skilled therapeutic intervention in order to improve the following deficits and impairments:  Pain, Decreased activity tolerance, Decreased range of motion  Visit Diagnosis: Chronic right shoulder pain  Stiffness of right shoulder, not elsewhere classified     Problem List There are no active problems to display for this patient.  Gabriela Eves, PT, DPT 07/15/2018, 9:37 AM  [facility] [address] [place], [place], [address] Phone: [phone]   Fax:  [phone]  Name: DIONTA LARKE MRN: [id number] Date of Birth: [birth year]

## 2018-07-18 ENCOUNTER — Ambulatory Visit: Payer: 59 | Admitting: *Deleted

## 2018-07-18 DIAGNOSIS — G8929 Other chronic pain: Secondary | ICD-10-CM

## 2018-07-18 DIAGNOSIS — M25611 Stiffness of right shoulder, not elsewhere classified: Secondary | ICD-10-CM

## 2018-07-18 DIAGNOSIS — M25511 Pain in right shoulder: Secondary | ICD-10-CM | POA: Diagnosis not present

## 2018-07-18 NOTE — Therapy (Signed)
[facility] [address] [place], [place], [address] Phone: [phone]   Fax:  [phone]  Physical Therapy Treatment  Patient Details  Name: Samuel Duran MRN: [id number] Date of Birth: 30-Oct-1970 Referring Provider (PT): Francena Hanly MD   Encounter Date: 07/18/2018  PT End of Session - 07/18/18 1730    Visit Number  22    Number of Visits  28    Date for PT Re-Evaluation  07/01/18    Authorization Type  FOTO; progress note every 10th visit    PT Start Time  1630    PT Stop Time  1730    PT Time Calculation (min)  60 min       Past Medical History:  Diagnosis Date  . Allergy   . Anxiety   . Asthma    allergy induced asthma    Past Surgical History:  Procedure Laterality Date  . CYST EXCISION     forehead  . 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  Subjective Assessment - 07/18/18 1645    Subjective  RT shldr 2/10 stiffness    Pertinent History  Dysplasia of shoulder    Currently in Pain?  Yes    Pain Score  2     Pain Location  Shoulder    Pain Orientation  Right    Pain Descriptors / Indicators  Sore    Pain Type  Surgical pain    Pain Onset  More than a month ago                       [facility] Adult PT Treatment/Exercise - 07/18/18 0001      Exercises   Exercises  Shoulder;Elbow      Elbow Exercises   Elbow Flexion  Strengthening;Right   3x 15-20   Bar Weights/Barbell (Elbow Flexion)  5 lbs      Shoulder Exercises: Supine   Diagonals Weight (lbs)  2# ball x10 each way D1, D2      Shoulder Exercises: Standing   Protraction  Strengthening;Right;20 reps;Theraband    Theraband Level (Shoulder Protraction)  Level 3 (Green)    External Rotation  Strengthening;Right;20 reps;Theraband    Internal Rotation   Strengthening;Right;20 reps;Theraband    Theraband Level (Shoulder Internal Rotation)  Level 3 (Green)    Flexion  Strengthening;Right;20 reps;Weights;10 reps    Shoulder Flexion Weight (lbs)  2    ABduction  Strengthening;Right;20 reps;Weights;10 reps    Shoulder ABduction Weight (lbs)  2    Row  Strengthening;Right;20 reps;10 reps;Theraband    Theraband Level (Shoulder Row)  Level 3 (Green)    Other Standing Exercises  R shoulder scaption 2# x20 reps      Shoulder Exercises: ROM/Strengthening   UBE (Upper Arm Bike)  60 RPM x8 min      Modalities   Modalities  Electrical Stimulation;Vasopneumatic      Electrical Stimulation   Electrical Stimulation Location  R shoulder  IFC x 15 mins 80-150hz     Electrical Stimulation Goals  Pain      Vasopneumatic   Number Minutes Vasopneumatic   15 minutes    Vasopnuematic Location   Shoulder    Vasopneumatic Pressure  Low    Vasopneumatic Temperature   38      Manual Therapy  Manual Therapy  Passive ROM;Joint mobilization    Passive ROM  PROM of R shoulder into flexion to 150 degrees , ER to 70 degrees      ROWS and extension 2# 3x10 each            PT Long Term Goals - 07/15/18 0900      PT LONG TERM GOAL #1   Title  Independent with a HEP.    Time  8    Period  Weeks    Status  Achieved      PT LONG TERM GOAL #2   Title  Active right shoulder flexion to 145 degrees so the patient can easily reach overhead.    Time  8    Period  Weeks    Status  Achieved      PT LONG TERM GOAL #3   Title  Active ER to 70 degrees+ to allow for easily donning/doffing of apparel.    Time  8    Period  Weeks    Status  On-going      PT LONG TERM GOAL #4   Title  Increase ROM so patient is able to reach behind back to L3.    Time  8    Period  Weeks    Status  On-going      PT LONG TERM GOAL #5   Title  Increase right shoulder strength to a solid 4+/5 to increase stability for performance of functional activities.    Time  8     Period  Weeks    Status  On-going            Plan - 07/18/18 1735    Clinical Impression Statement  Pt arrived today doing fairly well with mainly stiffness in RT shldr. He was able to complete all therex with mainly fatague. Standing elevation was the most challenging. PROM today flexion to 150 degrees and ER to 70 degrees. Normal modalities response    Clinical Presentation  Stable    Clinical Decision Making  Low    Rehab Potential  Excellent    Clinical Impairments Affecting Rehab Potential  surgery 04/18/18 currently 10 weeks 06/27/18    PT Frequency  2x / week    PT Duration  8 weeks    PT Treatment/Interventions  ADLs/Self Care Home Management;Cryotherapy;Electrical Stimulation;Therapeutic exercise;Therapeutic activities;Neuromuscular re-education;Patient/family education;Passive range of motion;Manual techniques;Vasopneumatic Device    PT Next Visit Plan  Continue with strengthening and AROM; modalities PRN    PT Home Exercise Plan  flexion table stretch, IR stretch with pulleys, ER table stretch       Patient will benefit from skilled therapeutic intervention in order to improve the following deficits and impairments:  Pain, Decreased activity tolerance, Decreased range of motion  Visit Diagnosis: Chronic right shoulder pain  Stiffness of right shoulder, not elsewhere classified     Problem List There are no active problems to display for this patient.   Indy Kuck,CHRIS, PTA 07/18/2018, 5:45 PM  [facility] [address] [place], [place], [address] Phone: [phone]   Fax:  [phone]  Name: Samuel Duran MRN: [id number] Date of Birth: [birth year]

## 2018-07-23 ENCOUNTER — Ambulatory Visit: Payer: 59 | Admitting: Physical Therapy

## 2018-07-23 ENCOUNTER — Encounter: Payer: Self-pay | Admitting: Physical Therapy

## 2018-07-23 DIAGNOSIS — M25611 Stiffness of right shoulder, not elsewhere classified: Secondary | ICD-10-CM

## 2018-07-23 DIAGNOSIS — G8929 Other chronic pain: Secondary | ICD-10-CM

## 2018-07-23 DIAGNOSIS — M25511 Pain in right shoulder: Secondary | ICD-10-CM | POA: Diagnosis not present

## 2018-07-23 NOTE — Therapy (Signed)
[facility] [address] [place], [place], [address] Phone: [phone]   Fax:  [phone]  Physical Therapy Treatment  Patient Details  Name: Samuel Duran MRN: [id number] Date of Birth: 10-22-1970 Referring Provider (PT): Francena HanlyKevin Supple MD   Encounter Date: 07/23/2018  PT End of Session - 07/23/18 0739    Visit Number  23    Number of Visits  32   per new order   Date for PT Re-Evaluation  08/23/18    Authorization Type  FOTO; progress note every 10th visit    PT Start Time  0730    PT Stop Time  0819    PT Time Calculation (min)  49 min    Activity Tolerance  Patient tolerated treatment well    Behavior During Therapy  [facility] for tasks assessed/performed       Past Medical History:  Diagnosis Date  . Allergy   . Anxiety   . Asthma    allergy induced asthma    Past Surgical History:  Procedure Laterality Date  . CYST EXCISION     forehead  . 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  Subjective Assessment - 07/23/18 0739    Subjective  Reports usual stiffness in the morning. States he has been sleeping with his arm tucked under his pillow but wakes up in pain.    Pertinent History  Dysplasia of shoulder    Patient Stated Goals  Use arm without pain.    Currently in Pain?  Yes    Pain Score  2     Pain Orientation  Right    Pain Descriptors / Indicators  Tightness;Sore    Pain Type  Surgical pain    Pain Onset  More than a month ago    Pain Frequency  Constant         [facility] PT Assessment - 07/23/18 0001      Assessment   Medical Diagnosis  Right total shoulder replacement.    Referring Provider (PT)  Francena HanlyKevin Supple MD    Onset Date/Surgical Date  04/18/18    Next MD Visit  May 2020                   [facility]  [facility]/Exercise - 07/23/18 0001      Shoulder Exercises: Standing   Protraction  Strengthening;Right;20 reps;Theraband   2x15   Theraband Level (Shoulder Protraction)  Level 3 (Green)    External Rotation  Strengthening;Right;20 reps;Theraband;10 reps   2x15   Theraband Level (Shoulder External Rotation)  Level 3 (Green)    Internal Rotation  Strengthening;Right;20 reps;Theraband;10 reps   2x15   Theraband Level (Shoulder Internal Rotation)  Level 3 (Green)    Flexion  Strengthening;Right;20 reps;Weights;10 reps   2x15   Shoulder Flexion Weight (lbs)  3    ABduction  Strengthening;Right;20 reps;Weights;10 reps   2x15   Shoulder ABduction Weight (lbs)  3    Row  Strengthening;Right;20 reps;10 reps;Theraband   2x15   Theraband Level (Shoulder Row)  Level 3 (Green)    Diagonals  Strengthening;Right;20 reps;Weights    Diagonals Weight (lbs)  2    Diagonals Limitations  D1 & D2 patterns    Shoulder Elevation Limitations  z    Other Standing Exercises  scapular clocks red x10  Other Standing Exercises  R shoulder scaption 3# x20 reps      Shoulder Exercises: Pulleys   Flexion  5 minutes      Shoulder Exercises: ROM/Strengthening   UBE (Upper Arm Bike)  60 RPM x8 min    Wall Pushups  20 reps      Modalities   Modalities  Electrical Stimulation;Vasopneumatic      Electrical Stimulation   Electrical Stimulation Location  Right shoulder    Electrical Stimulation Action  IFC    Electrical Stimulation Parameters  80-150 hz x15 mins    Electrical Stimulation Goals  Pain      Vasopneumatic   Number Minutes Vasopneumatic   15 minutes    Vasopnuematic Location   Shoulder    Vasopneumatic Pressure  Low    Vasopneumatic Temperature   38                  PT Long Term Goals - 07/15/18 0900      PT LONG TERM GOAL #1   Title  Independent with a HEP.    Time  8    Period  Weeks    Status  Achieved      PT LONG TERM GOAL #2   Title  Active right shoulder  flexion to 145 degrees so the patient can easily reach overhead.    Time  8    Period  Weeks    Status  Achieved      PT LONG TERM GOAL #3   Title  Active ER to 70 degrees+ to allow for easily donning/doffing of apparel.    Time  8    Period  Weeks    Status  On-going      PT LONG TERM GOAL #4   Title  Increase ROM so patient is able to reach behind back to L3.    Time  8    Period  Weeks    Status  On-going      PT LONG TERM GOAL #5   Title  Increase right shoulder strength to a solid 4+/5 to increase stability for performance of functional activities.    Time  8    Period  Weeks    Status  On-going            Plan - 07/23/18 0808    Clinical Impression Statement  Paitent was able to tolerate progression of treatment well with reports of muscle fatigue. Patient was able to demonstrate good form with all exercises. Per new order, additional visits were added to plan of care. Patient and PT discussed possibility of reducing to 1x per week towards end of POC to emphasize HEP. Patient reported understanding. Normal response to modalities upon removal.     Clinical Presentation  Stable    Clinical Decision Making  Low    Rehab Potential  Excellent    PT Frequency  2x / week    PT Duration  8 weeks    PT Treatment/Interventions  ADLs/Self Care Home Management;Cryotherapy;Electrical Stimulation;Therapeutic exercise;Therapeutic activities;Neuromuscular re-education;Patient/family education;Passive range of motion;Manual techniques;Vasopneumatic Device    PT Next Visit Plan  Continue with strengthening and AROM; modalities PRN    Consulted and Agree with Plan of Care  Patient       Patient will benefit from skilled therapeutic intervention in order to improve the following deficits and impairments:  Pain, Decreased activity tolerance, Decreased range of motion  Visit Diagnosis: Chronic right shoulder pain  Stiffness of right shoulder,  not elsewhere classified     Problem  List There are no active problems to display for this patient.  Guss Bunde, PT, DPT 07/23/2018, 8:29 AM  [facility] [address] [place], [place], [address] Phone: [phone]   Fax:  [phone]  Name: Samuel Duran MRN: [id number] Date of Birth: [birth year]

## 2018-07-25 ENCOUNTER — Ambulatory Visit: Payer: 59 | Admitting: Physical Therapy

## 2018-07-25 ENCOUNTER — Encounter: Payer: Self-pay | Admitting: Physical Therapy

## 2018-07-25 DIAGNOSIS — M25611 Stiffness of right shoulder, not elsewhere classified: Secondary | ICD-10-CM

## 2018-07-25 DIAGNOSIS — G8929 Other chronic pain: Secondary | ICD-10-CM

## 2018-07-25 DIAGNOSIS — M25511 Pain in right shoulder: Principal | ICD-10-CM

## 2018-07-25 NOTE — Therapy (Signed)
[facility] [address] [place], [place], [address] Phone: [phone]   Fax:  [phone]  Physical Therapy Treatment  Patient Details  Name: Samuel Duran MRN: [id number] Date of Birth: 1970/10/13 Referring Provider (PT): Francena Hanly MD   Encounter Date: 07/25/2018  PT End of Session - 07/25/18 1652    Visit Number  24    Number of Visits  32    Date for PT Re-Evaluation  08/23/18    Authorization Type  FOTO; progress note every 10th visit    PT Start Time  1647    PT Stop Time  1734    PT Time Calculation (min)  47 min    Activity Tolerance  Patient tolerated treatment well    Behavior During Therapy  [facility] for tasks assessed/performed       Past Medical History:  Diagnosis Date  . Allergy   . Anxiety   . Asthma    allergy induced asthma    Past Surgical History:  Procedure Laterality Date  . CYST EXCISION     forehead  . 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  Subjective Assessment - 07/25/18 1651    Subjective  No pain upon arrival.    Pertinent History  Dysplasia of shoulder    Patient Stated Goals  Use arm without pain.    Currently in Pain?  No/denies         [facility] PT Assessment - 07/25/18 0001      Assessment   Medical Diagnosis  Right total shoulder replacement.    Referring Provider (PT)  Francena Hanly MD    Onset Date/Surgical Date  04/18/18    Next MD Visit  May 2020                   [facility] Adult PT Treatment/Exercise - 07/25/18 0001      Shoulder Exercises: Standing   Protraction  Strengthening;Right;Theraband    Theraband Level (Shoulder Protraction)  Level 3 (Green)    Protraction Limitations  3x10 reps    Horizontal ABduction  Strengthening;Both;20 reps;Theraband    Theraband Level  (Shoulder Horizontal ABduction)  Level 2 (Red)    External Rotation  Strengthening;Right;Theraband    Theraband Level (Shoulder External Rotation)  Level 3 (Green)    External Rotation Limitations  3x10 reps    Internal Rotation  Strengthening;Right;Theraband    Theraband Level (Shoulder Internal Rotation)  Level 3 (Green)    Internal Rotation Limitations  3x10 reps    Flexion  Strengthening;Right;Weights    Shoulder Flexion Weight (lbs)  3    Flexion Limitations  3x10 reps    ABduction  Strengthening;Right;Weights    Shoulder ABduction Weight (lbs)  3    ABduction Limitations  3x10 reps    Row  Strengthening;Right;Theraband    Theraband Level (Shoulder Row)  Level 3 (Green)    Row Limitations  3x10 reps    Diagonals  Strengthening;Right;20 reps;Weights    Diagonals Weight (lbs)  2    Diagonals Limitations  D1 & D2 patterns    Other Standing Exercises  scapular clocks red 2x10    Other Standing Exercises  R shoulder scaption 3# x30 reps      Shoulder Exercises: Pulleys   Flexion  5 minutes      Shoulder Exercises:  ROM/Strengthening   UBE (Upper Arm Bike)  60 RPM x8 min    Wall Pushups  20 reps    Other ROM/Strengthening Exercises  Wall walks red theraband x10       Modalities   Modalities  Electrical Stimulation;Vasopneumatic      Electrical Stimulation   Electrical Stimulation Location  R shoulder    Electrical Stimulation Action  IFC    Electrical Stimulation Parameters  80-150 hz x15 min    Electrical Stimulation Goals  Pain      Vasopneumatic   Number Minutes Vasopneumatic   15 minutes    Vasopnuematic Location   Shoulder    Vasopneumatic Pressure  Low    Vasopneumatic Temperature   56                  PT Long Term Goals - 07/15/18 0900      PT LONG TERM GOAL #1   Title  Independent with a HEP.    Time  8    Period  Weeks    Status  Achieved      PT LONG TERM GOAL #2   Title  Active right shoulder flexion to 145 degrees so the patient can easily  reach overhead.    Time  8    Period  Weeks    Status  Achieved      PT LONG TERM GOAL #3   Title  Active ER to 70 degrees+ to allow for easily donning/doffing of apparel.    Time  8    Period  Weeks    Status  On-going      PT LONG TERM GOAL #4   Title  Increase ROM so patient is able to reach behind back to L3.    Time  8    Period  Weeks    Status  On-going      PT LONG TERM GOAL #5   Title  Increase right shoulder strength to a solid 4+/5 to increase stability for performance of functional activities.    Time  8    Period  Weeks    Status  On-going            Plan - 07/25/18 1724    Clinical Impression Statement  Patient tolerated today's treatment well with only complaints of fatigue during therex. Patient demonstrated only minimal R shoulder elevation especially when patient's reporting fatigue. Minimal instruction required for corrective instructions for intermittant exercises. Normal modalities response noted following removal of the modalities.    Rehab Potential  Excellent    Clinical Impairments Affecting Rehab Potential  surgery 04/18/18 currently 10 weeks 06/27/18    PT Frequency  2x / week    PT Duration  8 weeks    PT Treatment/Interventions  ADLs/Self Care Home Management;Cryotherapy;Electrical Stimulation;Therapeutic exercise;Therapeutic activities;Neuromuscular re-education;Patient/family education;Passive range of motion;Manual techniques;Vasopneumatic Device    PT Next Visit Plan  Continue with strengthening and AROM; modalities PRN    PT Home Exercise Plan  flexion table stretch, IR stretch with pulleys, ER table stretch    Consulted and Agree with Plan of Care  Patient       Patient will benefit from skilled therapeutic intervention in order to improve the following deficits and impairments:  Pain, Decreased activity tolerance, Decreased range of motion  Visit Diagnosis: Chronic right shoulder pain  Stiffness of right shoulder, not elsewhere  classified     Problem List There are no active problems to display for this patient.  Marvell Fuller, PTA 07/25/2018, 5:54 PM  [facility] [address] [place], [place], [address] Phone: [phone]   Fax:  [phone]  Name: Samuel Duran MRN: [id number] Date of Birth: [birth year]

## 2018-07-30 ENCOUNTER — Encounter: Payer: Self-pay | Admitting: Physical Therapy

## 2018-07-30 ENCOUNTER — Ambulatory Visit: Payer: 59 | Attending: Orthopedic Surgery | Admitting: Physical Therapy

## 2018-07-30 DIAGNOSIS — G8929 Other chronic pain: Secondary | ICD-10-CM | POA: Insufficient documentation

## 2018-07-30 DIAGNOSIS — M25611 Stiffness of right shoulder, not elsewhere classified: Secondary | ICD-10-CM | POA: Diagnosis not present

## 2018-07-30 DIAGNOSIS — M25511 Pain in right shoulder: Secondary | ICD-10-CM | POA: Insufficient documentation

## 2018-07-30 NOTE — Therapy (Signed)
[facility] [address] [place], [place], [address] Phone: [phone]   Fax:  [phone]  Physical Therapy Treatment  Patient Details  Name: Samuel Duran MRN: [id number] Date of Birth: 08/10/1970 Referring Provider (PT): Francena Hanly MD   Encounter Date: 07/30/2018  PT End of Session - 07/30/18 0733    Visit Number  25    Number of Visits  32    Date for PT Re-Evaluation  08/23/18    Authorization Type  FOTO; progress note every 10th visit    PT Start Time  0730    PT Stop Time  0820    PT Time Calculation (min)  50 min    Activity Tolerance  Patient tolerated treatment well    Behavior During Therapy  [facility] for tasks assessed/performed       Past Medical History:  Diagnosis Date  . Allergy   . Anxiety   . Asthma    allergy induced asthma    Past Surgical History:  Procedure Laterality Date  . CYST EXCISION     forehead  . 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  Subjective Assessment - 07/30/18 0731    Subjective  Patient reports not pain, just stiff this morning.    Pertinent History  Dysplasia of shoulder    Patient Stated Goals  Use arm without pain.    Currently in Pain?  No/denies         [facility] PT Assessment - 07/30/18 0001      Assessment   Medical Diagnosis  Right total shoulder replacement.    Referring Provider (PT)  Francena Hanly MD    Onset Date/Surgical Date  04/18/18    Next MD Visit  May 2020                   [facility] Adult PT Treatment/Exercise - 07/30/18 0001      Shoulder Exercises: Prone   Other Prone Exercises  WITTY x10 2#      Shoulder Exercises: Standing   Protraction  Strengthening;Right;Theraband    Theraband Level (Shoulder Protraction)  Level 3 (Green)    Protraction Limitations   3x10 reps    External Rotation  Strengthening;Right;Theraband    Theraband Level (Shoulder External Rotation)  Level 3 (Green)    External Rotation Limitations  3x10 reps    Internal Rotation  Strengthening;Right;Theraband    Theraband Level (Shoulder Internal Rotation)  Level 3 (Green)    Internal Rotation Limitations  3x10 reps    Row  Strengthening;Right;Theraband    Theraband Level (Shoulder Row)  Level 3 (Green)    Row Limitations  3x10 reps    Diagonals  Strengthening;Right;20 reps;Weights    Diagonals Weight (lbs)  4    Diagonals Limitations  D1 & D2 patterns with medicine ball      Shoulder Exercises: Pulleys   Flexion  5 minutes      Shoulder Exercises: ROM/Strengthening   UBE (Upper Arm Bike)  60 RPM x8 min    Wall Pushups  20 reps    Other ROM/Strengthening Exercises  wall angels x20      Shoulder Exercises: Body Blade   Flexion  30 seconds;3 reps    ABduction  30 seconds;3 reps    External Rotation  30 seconds;3 reps  Modalities   Modalities  Programmer, applications Location  R shoulder    Electrical Stimulation Action  IFC    Electrical Stimulation Parameters  80-150 hz x15 mins    Electrical Stimulation Goals  Pain      Vasopneumatic   Number Minutes Vasopneumatic   15 minutes    Vasopnuematic Location   Shoulder    Vasopneumatic Pressure  Low    Vasopneumatic Temperature   46                  PT Long Term Goals - 07/15/18 0900      PT LONG TERM GOAL #1   Title  Independent with a HEP.    Time  8    Period  Weeks    Status  Achieved      PT LONG TERM GOAL #2   Title  Active right shoulder flexion to 145 degrees so the patient can easily reach overhead.    Time  8    Period  Weeks    Status  Achieved      PT LONG TERM GOAL #3   Title  Active ER to 70 degrees+ to allow for easily donning/doffing of apparel.    Time  8    Period  Weeks    Status  On-going      PT  LONG TERM GOAL #4   Title  Increase ROM so patient is able to reach behind back to L3.    Time  8    Period  Weeks    Status  On-going      PT LONG TERM GOAL #5   Title  Increase right shoulder strength to a solid 4+/5 to increase stability for performance of functional activities.    Time  8    Period  Weeks    Status  On-going            Plan - 07/30/18 2725    Clinical Impression Statement  Patient was able to tolerate progression of treatment well with mainly reports of fatigue. Patient noted with trunk compensations with WITTY especially with fatigue. Patient noted with good form with body blade.  Normal response to modalities upon removal.     Stability/Clinical Decision Making  Stable/Uncomplicated    Clinical Decision Making  Low    Rehab Potential  Excellent    PT Frequency  2x / week    PT Duration  8 weeks    PT Treatment/Interventions  ADLs/Self Care Home Management;Cryotherapy;Electrical Stimulation;Therapeutic exercise;Therapeutic activities;Neuromuscular re-education;Patient/family education;Passive range of motion;Manual techniques;Vasopneumatic Device    PT Next Visit Plan  Continue with strengthening and AROM; modalities PRN    Consulted and Agree with Plan of Care  Patient       Patient will benefit from skilled therapeutic intervention in order to improve the following deficits and impairments:  Pain, Decreased activity tolerance, Decreased range of motion  Visit Diagnosis: Chronic right shoulder pain  Stiffness of right shoulder, not elsewhere classified     Problem List There are no active problems to display for this patient.  Guss Bunde, PT, DPT 07/30/2018, 8:25 AM  [facility] [address] [place], [place], [address] Phone: [phone]   Fax:  [phone]  Name: Samuel Duran MRN: [id number] Date of Birth: 06-Jun-1970

## 2018-07-31 DIAGNOSIS — T63451D Toxic effect of venom of hornets, accidental (unintentional), subsequent encounter: Secondary | ICD-10-CM | POA: Diagnosis not present

## 2018-08-01 ENCOUNTER — Ambulatory Visit: Payer: 59 | Admitting: Physical Therapy

## 2018-08-01 ENCOUNTER — Encounter: Payer: Self-pay | Admitting: Physical Therapy

## 2018-08-01 DIAGNOSIS — M25511 Pain in right shoulder: Principal | ICD-10-CM

## 2018-08-01 DIAGNOSIS — G8929 Other chronic pain: Secondary | ICD-10-CM

## 2018-08-01 DIAGNOSIS — M25611 Stiffness of right shoulder, not elsewhere classified: Secondary | ICD-10-CM

## 2018-08-01 NOTE — Therapy (Signed)
[facility] [address] [place], [place], [address] Phone: [phone]   Fax:  [phone]  Physical Therapy Treatment  Patient Details  Name: Samuel Duran MRN: [id number] Date of Birth: 1970/06/01 Referring Provider (PT): Francena Hanly MD   Encounter Date: 08/01/2018  PT End of Session - 08/01/18 1743    Visit Number  26    Number of Visits  32    Date for PT Re-Evaluation  08/23/18    Authorization Type  FOTO; progress note every 10th visit    PT Start Time  1645    PT Stop Time  1738    PT Time Calculation (min)  53 min    Activity Tolerance  Patient tolerated treatment well    Behavior During Therapy  [facility] for tasks assessed/performed       Past Medical History:  Diagnosis Date  . Allergy   . Anxiety   . Asthma    allergy induced asthma    Past Surgical History:  Procedure Laterality Date  . CYST EXCISION     forehead  . 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  Subjective Assessment - 08/01/18 1751    Subjective  Patient reports still having difficulties with sleeping as he wakes up on it causing pain. Patient reporting to attempt to sleep on back to reduce pain    Pertinent History  Dysplasia of shoulder    Patient Stated Goals  Use arm without pain.    Currently in Pain?  No/denies         [facility] PT Assessment - 08/01/18 0001      Assessment   Medical Diagnosis  Right total shoulder replacement.    Referring Provider (PT)  Francena Hanly MD    Onset Date/Surgical Date  04/18/18    Next MD Visit  May 2020                   [facility] Adult PT Treatment/Exercise - 08/01/18 0001      Shoulder Exercises: Supine   Protraction  Strengthening;Right    Protraction Weight (lbs)  5    ABduction  AROM;Right;20 reps;10  reps      Shoulder Exercises: Sidelying   Other Sidelying Exercises  sidelying trunk rotation x10      Shoulder Exercises: Standing   Other Standing Exercises  Y's with liftoff red theraband 2x10      Shoulder Exercises: Pulleys   Flexion  5 minutes      Shoulder Exercises: ROM/Strengthening   UBE (Upper Arm Bike)  60 RPM x8 min    Pushups  10 reps    Pushups Limitations  on elevated plinth    Ball on Wall  flexion with red theraball x30      Shoulder Exercises: Body Blade   Flexion  30 seconds;3 reps    ABduction  30 seconds;3 reps    External Rotation  30 seconds;3 reps      Modalities   Modalities  Electrical Stimulation;Vasopneumatic      Electrical Stimulation   Electrical Stimulation Location  R shoulder    Electrical Stimulation Action  IFC    Electrical Stimulation Parameters  80-150 hz x15 mins    Electrical Stimulation Goals  Pain      Vasopneumatic   Number Minutes Vasopneumatic  15 minutes    Vasopnuematic Location   Shoulder    Vasopneumatic Pressure  Low    Vasopneumatic Temperature   46                  PT Long Term Goals - 07/15/18 0900      PT LONG TERM GOAL #1   Title  Independent with a HEP.    Time  8    Period  Weeks    Status  Achieved      PT LONG TERM GOAL #2   Title  Active right shoulder flexion to 145 degrees so the patient can easily reach overhead.    Time  8    Period  Weeks    Status  Achieved      PT LONG TERM GOAL #3   Title  Active ER to 70 degrees+ to allow for easily donning/doffing of apparel.    Time  8    Period  Weeks    Status  On-going      PT LONG TERM GOAL #4   Title  Increase ROM so patient is able to reach behind back to L3.    Time  8    Period  Weeks    Status  On-going      PT LONG TERM GOAL #5   Title  Increase right shoulder strength to a solid 4+/5 to increase stability for performance of functional activities.    Time  8    Period  Weeks    Status  On-going            Plan -  08/01/18 1746    Clinical Impression Statement  Patient performed all exercises well with improved form. Patient noted with weakness with abduction body blade but otherwise was able to complete exercise. Normal response to modalties upon removal.     Stability/Clinical Decision Making  Stable/Uncomplicated    Clinical Decision Making  Low    Rehab Potential  Excellent    PT Frequency  2x / week    PT Duration  8 weeks    PT Treatment/Interventions  ADLs/Self Care Home Management;Cryotherapy;Electrical Stimulation;Therapeutic exercise;Therapeutic activities;Neuromuscular re-education;Patient/family education;Passive range of motion;Manual techniques;Vasopneumatic Device    PT Next Visit Plan  Continue with strengthening and AROM; modalities PRN    Consulted and Agree with Plan of Care  Patient       Patient will benefit from skilled therapeutic intervention in order to improve the following deficits and impairments:  Pain, Decreased activity tolerance, Decreased range of motion  Visit Diagnosis: Chronic right shoulder pain  Stiffness of right shoulder, not elsewhere classified     Problem List There are no active problems to display for this patient.  Guss Bunde, PT, DPT 08/01/2018, 5:53 PM  [facility] [address] [place], [place], [address] Phone: [phone]   Fax:  [phone]  Name: CAMILA THRASHER MRN: [id number] Date of Birth: May 25, 1971

## 2018-08-05 ENCOUNTER — Ambulatory Visit: Payer: 59 | Admitting: Physical Therapy

## 2018-08-05 ENCOUNTER — Encounter: Payer: Self-pay | Admitting: Physical Therapy

## 2018-08-05 DIAGNOSIS — M25511 Pain in right shoulder: Principal | ICD-10-CM

## 2018-08-05 DIAGNOSIS — G8929 Other chronic pain: Secondary | ICD-10-CM

## 2018-08-05 DIAGNOSIS — M25611 Stiffness of right shoulder, not elsewhere classified: Secondary | ICD-10-CM

## 2018-08-05 NOTE — Therapy (Signed)
[facility] [address] [place], [place], [address] Phone: [phone]   Fax:  [phone]  Physical Therapy Treatment  Patient Details  Name: Samuel Duran MRN: [id number] Date of Birth: December 16, 1970 Referring Provider (PT): Francena Hanly MD   Encounter Date: 08/05/2018  PT End of Session - 08/05/18 0809    Visit Number  27    Number of Visits  32    Date for PT Re-Evaluation  08/23/18    Authorization Type  FOTO; progress note every 10th visit    PT Start Time  0733    PT Stop Time  0824    PT Time Calculation (min)  51 min    Activity Tolerance  Patient tolerated treatment well    Behavior During Therapy  [facility] for tasks assessed/performed       Past Medical History:  Diagnosis Date  . Allergy   . Anxiety   . Asthma    allergy induced asthma    Past Surgical History:  Procedure Laterality Date  . CYST EXCISION     forehead  . 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  Subjective Assessment - 08/05/18 0808    Subjective  Patient reported feeling pretty good this morning and stated must have slept better on his shoulder last night.    Pertinent History  Dysplasia of shoulder    Patient Stated Goals  Use arm without pain.    Currently in Pain?  No/denies                       [facility] Adult PT Treatment/Exercise - 08/05/18 0001      Shoulder Exercises: Standing   Protraction  Strengthening;Right;Theraband    Theraband Level (Shoulder Protraction)  Level 3 (Green)    Protraction Limitations  3x10 reps    External Rotation  Strengthening;Right;Theraband    Theraband Level (Shoulder External Rotation)  Level 3 (Green)    External Rotation Limitations  3x10 reps    Internal Rotation  Strengthening;Right;Theraband     Theraband Level (Shoulder Internal Rotation)  Level 3 (Green)    Internal Rotation Limitations  3x10 reps    Flexion  Strengthening;Right;Weights    Shoulder Flexion Weight (lbs)  3    Flexion Limitations  3x10 reps    ABduction  Strengthening;Right;Weights    Shoulder ABduction Weight (lbs)  3    ABduction Limitations  3x10 reps    Row  Strengthening;Right;Theraband    Theraband Level (Shoulder Row)  Level 3 (Green)    Row Limitations  3x10 reps    Diagonals  Strengthening;Right;20 reps;Weights;10 reps    Diagonals Weight (lbs)  3    Other Standing Exercises  lat pull downs 3x10 pink XTS      Shoulder Exercises: Pulleys   Flexion  5 minutes      Shoulder Exercises: ROM/Strengthening   UBE (Upper Arm Bike)  60 RPM x8 min    Pushups  20 reps    Pushups Limitations  on elevated plinth    Other ROM/Strengthening Exercises  Scapular clocks red theraband x15      Modalities   Modalities  Electrical Stimulation;Vasopneumatic      Electrical Stimulation   Electrical Stimulation Location  R shoulder    Electrical Stimulation Action  IFC  Electrical Stimulation Parameters  80-150 hz x15 min    Electrical Stimulation Goals  Pain      Vasopneumatic   Number Minutes Vasopneumatic   15 minutes    Vasopnuematic Location   Shoulder    Vasopneumatic Pressure  Low                  PT Long Term Goals - 07/15/18 0900      PT LONG TERM GOAL #1   Title  Independent with a HEP.    Time  8    Period  Weeks    Status  Achieved      PT LONG TERM GOAL #2   Title  Active right shoulder flexion to 145 degrees so the patient can easily reach overhead.    Time  8    Period  Weeks    Status  Achieved      PT LONG TERM GOAL #3   Title  Active ER to 70 degrees+ to allow for easily donning/doffing of apparel.    Time  8    Period  Weeks    Status  On-going      PT LONG TERM GOAL #4   Title  Increase ROM so patient is able to reach behind back to L3.    Time  8    Period   Weeks    Status  On-going      PT LONG TERM GOAL #5   Title  Increase right shoulder strength to a solid 4+/5 to increase stability for performance of functional activities.    Time  8    Period  Weeks    Status  On-going            Plan - 08/05/18 0809    Clinical Impression Statement  Patient was able to tolerate treatment well but noted with increased difficulties with modified push ups from plinth due to strength deficits. Patient expressed continued difficulties with flexion above 90 degrees due to lack of strength. Patient noted with normal responses upon removal of modalities.     Stability/Clinical Decision Making  Stable/Uncomplicated    Clinical Decision Making  Low    Rehab Potential  Excellent    Clinical Impairments Affecting Rehab Potential  surgery 04/18/18 currently 10 weeks 06/27/18    PT Frequency  2x / week    PT Duration  8 weeks    PT Treatment/Interventions  ADLs/Self Care Home Management;Cryotherapy;Electrical Stimulation;Therapeutic exercise;Therapeutic activities;Neuromuscular re-education;Patient/family education;Passive range of motion;Manual techniques;Vasopneumatic Device;Manual lymph drainage    PT Next Visit Plan  attempt strengthening from 90 and above; Continue with strengthening and AROM; modalities PRN    Consulted and Agree with Plan of Care  Patient       Patient will benefit from skilled therapeutic intervention in order to improve the following deficits and impairments:  Pain, Decreased activity tolerance, Decreased range of motion  Visit Diagnosis: Chronic right shoulder pain  Stiffness of right shoulder, not elsewhere classified     Problem List There are no active problems to display for this patient.  Guss Bunde, PT, DPT 08/05/2018, 8:28 AM  [facility] [address] [place], [place], [address] Phone: [phone]   Fax:  [phone]  Name: Samuel Duran MRN:  [id number] Date of Birth: 09-20-1970

## 2018-08-08 ENCOUNTER — Encounter: Payer: 59 | Admitting: Physical Therapy

## 2018-08-13 ENCOUNTER — Encounter: Payer: Self-pay | Admitting: Physical Therapy

## 2018-08-13 ENCOUNTER — Ambulatory Visit: Payer: 59 | Admitting: Physical Therapy

## 2018-08-13 ENCOUNTER — Other Ambulatory Visit: Payer: Self-pay

## 2018-08-13 DIAGNOSIS — G8929 Other chronic pain: Secondary | ICD-10-CM

## 2018-08-13 DIAGNOSIS — M25511 Pain in right shoulder: Secondary | ICD-10-CM | POA: Diagnosis not present

## 2018-08-13 DIAGNOSIS — M25611 Stiffness of right shoulder, not elsewhere classified: Secondary | ICD-10-CM

## 2018-08-13 NOTE — Therapy (Addendum)
[facility] [place], [place], [address] Phone: [phone]   Fax:  [phone]  Physical Therapy Treatment PHYSICAL THERAPY DISCHARGE SUMMARY  Visits from Start of Care: 28  Current functional level related to goals / functional outcomes: See below   Remaining deficits: See goals   Education / Equipment: HEP Plan: Patient agrees to discharge.  Patient goals were partially met. Patient is being discharged due to being pleased with the current functional level.  ?????    Gabriela Eves, PT, DPT 04/28/19  Patient Details  Name: Samuel Duran MRN: [id number] Date of Birth: 03/10/1971 Referring Provider (PT): Justice Britain MD   Encounter Date: 08/13/2018  PT End of Session - 08/13/18 0735    Visit Number  28    Number of Visits  32    Date for PT Re-Evaluation  08/23/18    Authorization Type  FOTO; progress note every 10th visit    PT Start Time  0731    PT Stop Time  0829    PT Time Calculation (min)  58 min    Activity Tolerance  Patient tolerated treatment well    Behavior During Therapy  [facility] for tasks assessed/performed       Past Medical History:  Diagnosis Date  . Allergy   . Anxiety   . Asthma    allergy induced asthma    Past Surgical History:  Procedure Laterality Date  . CYST EXCISION     forehead  . 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      [facility] PT Assessment - 08/13/18 0001      Assessment   Medical Diagnosis  Right total shoulder replacement.    Referring Provider (PT)  Justice Britain MD    Onset Date/Surgical Date  04/18/18    Next MD Visit  May 2020                   [facility] Adult PT Treatment/Exercise - 08/13/18 0001      Shoulder Exercises: Standing   Protraction   Strengthening;Right;Theraband    Theraband Level (Shoulder Protraction)  Level 3 (Green)    Protraction Limitations  3x10 reps    Horizontal ABduction  Strengthening;Both;20 reps;Theraband;10 reps    Theraband Level (Shoulder Horizontal ABduction)  Level 2 (Red)    Horizontal ABduction Limitations  3x10    External Rotation  Strengthening;Right;Theraband    Theraband Level (Shoulder External Rotation)  Level 3 (Green)    External Rotation Limitations  3x10 reps    Internal Rotation  Strengthening;Right;Theraband    Theraband Level (Shoulder Internal Rotation)  Level 3 (Green)    Internal Rotation Limitations  3x10 reps    Flexion  Strengthening;Right;Weights    Shoulder Flexion Weight (lbs)  3    Flexion Limitations  3x10 reps    ABduction  Strengthening;Right;Weights    Shoulder ABduction Weight (lbs)  3    ABduction Limitations  3x10 reps    Row  Strengthening;Right;Theraband    Theraband Level (Shoulder Row)  Level 3 (Green)    Row Limitations  3x10 reps    Diagonals  Strengthening;Right;20 reps;Weights;10 reps    Diagonals Weight (lbs)  3    Other Standing Exercises  Scaption 3# 3x10    Other Standing Exercises  scapular clocks x15 red followed  by ball tosses against the wall x30      Shoulder Exercises: Pulleys   Flexion  5 minutes      Shoulder Exercises: ROM/Strengthening   UBE (Upper Arm Bike)  30 RPM x6 mins    Modified Plank  2 reps;30 seconds    Modified Plank Limitations  followed by shoulder taps x10 each    Other ROM/Strengthening Exercises  2# top shelf lift x30    Other ROM/Strengthening Exercises  eccentric biceps wiht green TB 3x10      Modalities   Modalities  Electrical Stimulation;Vasopneumatic      Electrical Stimulation   Electrical Stimulation Location  R shoulder     Electrical Stimulation Action  IFC    Electrical Stimulation Parameters  80-150 hz x15 mins    Electrical Stimulation Goals  Pain      Vasopneumatic   Number Minutes Vasopneumatic    15 minutes    Vasopnuematic Location   Shoulder    Vasopneumatic Pressure  Low    Vasopneumatic Temperature   46                  PT Long Term Goals - 07/15/18 0900      PT LONG TERM GOAL #1   Title  Independent with a HEP.    Time  8    Period  Weeks    Status  Achieved      PT LONG TERM GOAL #2   Title  Active right shoulder flexion to 145 degrees so the patient can easily reach overhead.    Time  8    Period  Weeks    Status  Achieved      PT LONG TERM GOAL #3   Title  Active ER to 70 degrees+ to allow for easily donning/doffing of apparel.    Time  8    Period  Weeks    Status  On-going      PT LONG TERM GOAL #4   Title  Increase ROM so patient is able to reach behind back to L3.    Time  8    Period  Weeks    Status  On-going      PT LONG TERM GOAL #5   Title  Increase right shoulder strength to a solid 4+/5 to increase stability for performance of functional activities.    Time  8    Period  Weeks    Status  On-going            Plan - 08/13/18 1017    Clinical Impression Statement  Patient was able to tolerate progression well. Patient noted with increased fatigue with 2# lift to top shelf with some UT compensation especially towards end of reps. Normal response to modalities upon removal.     Stability/Clinical Decision Making  Stable/Uncomplicated    Clinical Decision Making  Low    Rehab Potential  Excellent    Clinical Impairments Affecting Rehab Potential  surgery 04/18/18 currently 10 weeks 06/27/18    PT Frequency  2x / week    PT Duration  8 weeks    PT Treatment/Interventions  ADLs/Self Care Home Management;Cryotherapy;Electrical Stimulation;Therapeutic exercise;Therapeutic activities;Neuromuscular re-education;Patient/family education;Passive range of motion;Manual techniques;Vasopneumatic Device;Manual lymph drainage    PT Next Visit Plan  attempt strengthening from 90 and above; Continue with strengthening and AROM; modalities PRN     Consulted and Agree with Plan of Care  Patient       Patient will benefit from  skilled therapeutic intervention in order to improve the following deficits and impairments:  Pain, Decreased activity tolerance, Decreased range of motion  Visit Diagnosis: Chronic right shoulder pain  Stiffness of right shoulder, not elsewhere classified     Problem List There are no active problems to display for this patient.  Gabriela Eves, PT, DPT 08/13/2018, 9:32 AM  [facility] [address] [place], [place], [address] Phone: [phone]   Fax:  [phone]  Name: Samuel Duran MRN: [id number] Date of Birth: Jul 08, 1970

## 2018-08-16 ENCOUNTER — Encounter: Payer: 59 | Admitting: Physical Therapy

## 2018-08-19 ENCOUNTER — Encounter: Payer: 59 | Admitting: Physical Therapy

## 2018-08-22 ENCOUNTER — Encounter: Payer: 59 | Admitting: Physical Therapy

## 2018-08-27 DIAGNOSIS — T63451D Toxic effect of venom of hornets, accidental (unintentional), subsequent encounter: Secondary | ICD-10-CM | POA: Diagnosis not present

## 2018-08-30 ENCOUNTER — Telehealth: Payer: Self-pay | Admitting: Physical Therapy

## 2018-08-30 NOTE — Telephone Encounter (Signed)
  Samuel Duran was contacted today regarding the temporary reduction of OP Rehab Services due to concerns for community transmission of Covid-19.    Therapist advised the patient to continue to perform their HEP and assured they had no unanswered questions at this time. Outpatient Rehabilitation Services will follow up with this client. Patient is aware we can be reached by telephone during limited business hours in the meantime.

## 2018-10-03 DIAGNOSIS — T63461D Toxic effect of venom of wasps, accidental (unintentional), subsequent encounter: Secondary | ICD-10-CM | POA: Diagnosis not present

## 2018-10-16 DIAGNOSIS — Z96611 Presence of right artificial shoulder joint: Secondary | ICD-10-CM | POA: Diagnosis not present

## 2018-10-16 DIAGNOSIS — Z471 Aftercare following joint replacement surgery: Secondary | ICD-10-CM | POA: Diagnosis not present

## 2019-07-21 IMAGING — CT CT SHOULDER*R* W/O CM
2 of 7 series · 4 of 14 positions shown, 5 images · non-contrast
Comparison: None.

CLINICAL DATA: Right shoulder pain for years

EXAM:
CT OF THE UPPER RIGHT EXTREMITY WITHOUT CONTRAST
TECHNIQUE: Multidetector CT imaging of the upper right extremity was performed
according to the standard protocol.

[Series 6: shoulder 0.60 br40 s3 thins · axial · 0.54mm/px · z∈[-793,-714]mm · 2 of 395 slices shown, 3 images]
[im 132/395  soft-tissue]
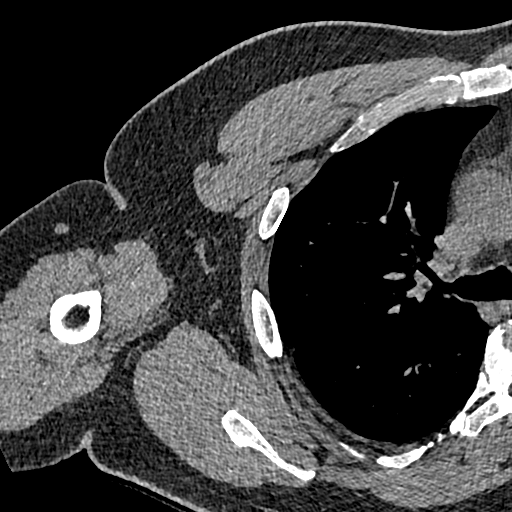
[im 132/395  bone]
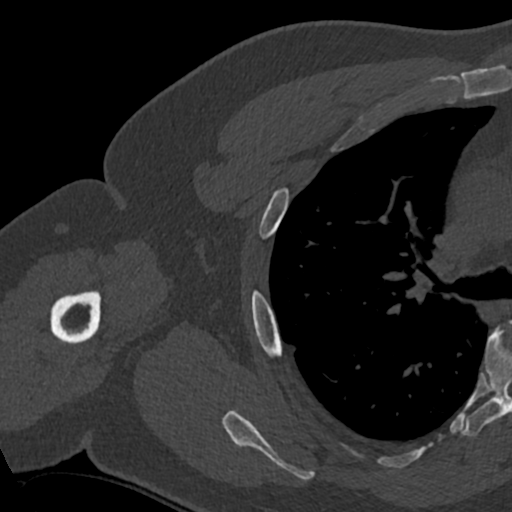
[im 263/395  bone]
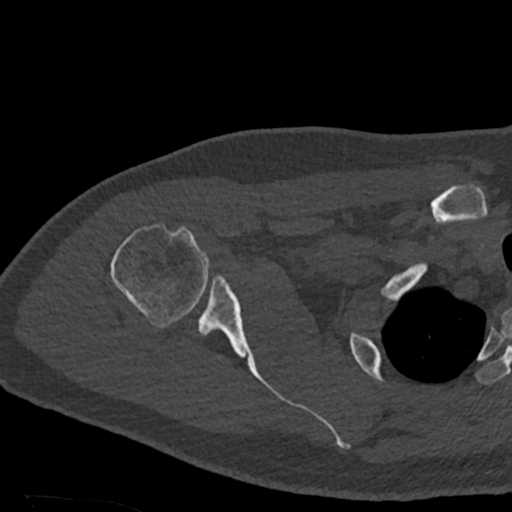

[Series 7: shoulder 0.60 br60 s3 thins · axial · 0.54mm/px · z∈[-793,-714]mm · 2 of 395 slices shown]
[im 132/395  bone]
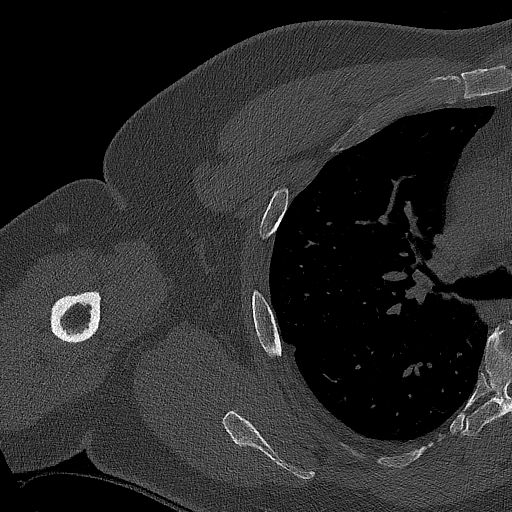
[im 263/395  bone]
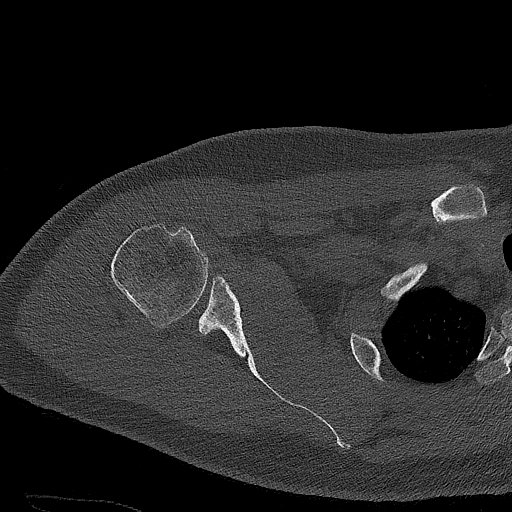

[4 of 14 positions shown; findings below may reference images not displayed]

FINDINGS: Bones/Joint/Cartilage

No fracture or dislocation. Normal alignment. No joint effusion.

Mild-moderate glenohumeral joint space narrowing with marginal
osteophytes. Subchondral cyst in the posterior glenoid measuring 5
mm. Mild glenoid retroversion.

Mild arthropathy of the acromioclavicular joint.  Type I acromion.

No aggressive osseous lesion.

Ligaments

Ligaments are suboptimally evaluated by CT.

Muscles and Tendons
Muscles are normal.  No muscle atrophy.

Soft tissue
No fluid collection or hematoma.  No soft tissue mass.
IMPRESSION: Mild-moderate osteoarthritis of the right glenohumeral joint with
mild glenoid retroversion.

## 2020-01-29 DIAGNOSIS — M25512 Pain in left shoulder: Secondary | ICD-10-CM | POA: Insufficient documentation

## 2021-08-30 DIAGNOSIS — H9313 Tinnitus, bilateral: Secondary | ICD-10-CM | POA: Insufficient documentation

## 2022-12-12 ENCOUNTER — Encounter: Payer: Self-pay | Admitting: Dermatology

## 2022-12-12 ENCOUNTER — Ambulatory Visit (INDEPENDENT_AMBULATORY_CARE_PROVIDER_SITE_OTHER): Payer: 59 | Admitting: Dermatology

## 2022-12-12 VITALS — BP 148/97 | HR 63

## 2022-12-12 DIAGNOSIS — B079 Viral wart, unspecified: Secondary | ICD-10-CM

## 2022-12-12 DIAGNOSIS — Z1283 Encounter for screening for malignant neoplasm of skin: Secondary | ICD-10-CM

## 2022-12-12 DIAGNOSIS — L821 Other seborrheic keratosis: Secondary | ICD-10-CM

## 2022-12-12 DIAGNOSIS — W908XXA Exposure to other nonionizing radiation, initial encounter: Secondary | ICD-10-CM

## 2022-12-12 DIAGNOSIS — L578 Other skin changes due to chronic exposure to nonionizing radiation: Secondary | ICD-10-CM

## 2022-12-12 DIAGNOSIS — L570 Actinic keratosis: Secondary | ICD-10-CM

## 2022-12-12 DIAGNOSIS — D1801 Hemangioma of skin and subcutaneous tissue: Secondary | ICD-10-CM

## 2022-12-12 DIAGNOSIS — L739 Follicular disorder, unspecified: Secondary | ICD-10-CM | POA: Diagnosis not present

## 2022-12-12 DIAGNOSIS — D229 Melanocytic nevi, unspecified: Secondary | ICD-10-CM

## 2022-12-12 DIAGNOSIS — L814 Other melanin hyperpigmentation: Secondary | ICD-10-CM

## 2022-12-12 MED ORDER — CLINDAMYCIN PHOSPHATE 1 % EX LOTN: TOPICAL_LOTION | Freq: Every day | CUTANEOUS | 1 refills | Status: AC

## 2022-12-12 NOTE — Progress Notes (Signed)
New Patient Visit   Subjective  Samuel Duran is a 52 y.o. male who presents for the following: TBSE  Patient denies Hx of bx. Patient denies family history of skin cancer(s).  Patient reports throughout his lifetime he has had moderate sun exposure (He has a few sunburns during his childhood). Currently, if he has sun exposure he does apply sunscreen.  The following portions of the chart were reviewed this encounter and updated as appropriate: medications, allergies, medical history  Review of Systems:  No other skin or systemic complaints except as noted in HPI or Assessment and Plan.  Objective  Well appearing patient in no apparent distress; mood and affect are within normal limits.  A full examination was performed including scalp, head, eyes, ears, nose, lips, neck, chest, axillae, abdomen, back, buttocks, bilateral upper extremities, bilateral lower extremities, hands, feet, fingers, toes, fingernails, and toenails. All findings within normal limits unless otherwise noted below.   Relevant exam findings are noted in the Assessment and Plan.   Assessment & Plan   LENTIGINES, SEBORRHEIC KERATOSES, HEMANGIOMAS - Benign normal skin lesions - Benign-appearing - Call for any changes  MELANOCYTIC NEVI - Tan-brown and/or pink-flesh-colored symmetric macules and papules - Benign appearing on exam today - Observation - Call clinic for new or changing moles - Recommend daily use of broad spectrum spf 30+ sunscreen to sun-exposed areas.   ACTINIC DAMAGE - Chronic condition, secondary to cumulative UV/sun exposure - diffuse scaly erythematous macules with underlying dyspigmentation - Recommend daily broad spectrum sunscreen SPF 30+ to sun-exposed areas, reapply every 2 hours as needed.  - Staying in the shade or wearing long sleeves, sun glasses (UVA+UVB protection) and wide brim hats (4-inch brim around the entire circumference of the hat) are also recommended for sun protection.   - Call for new or changing lesions.  SKIN CANCER SCREENING PERFORMED TODAY  Actinic KERATOSES Exam: Pink gritty flat top papules located at scalp  Treatment Plan:  - Cryo therapy completed in office today, Patient provided with Care instructions - We will plan to follow up in 6 months to reassess areas  WART Exam: Multiple verrucous papule(s) involving forehead, Eyelid, Underneath B/L eye  Counseling Discussed viral / HPV (Human Papilloma Virus) etiology and risk of spread /infectivity to other areas of body as well as to other people.  Multiple treatments and methods may be required to clear warts and it is possible treatment may not be successful.  Treatment risks include discoloration; scarring and there is still potential for wart recurrence.  Treatment Plan: - Cryotherapy completed while in office today, Patient provided with care instructions - We will plan to follow up in 1 month to reassess areas  FOLLICULITIS Exam: Perifollicular erythematous papules and pustules B/L LE and back  Treatment Plan: - We Will prescribe Clindamycin Lotion to apply to affected areas until healed  AK (actinic keratosis) (3) Mid Parietal Scalp  Viral warts, unspecified type (7) Left Lateral Canthus; Mid Parietal Scalp (2); Right Malar Cheek (2); Left Forehead; Right Forehead  Destruction of lesion - Left Forehead, Left Lateral Canthus, Mid Parietal Scalp (2), Right Forehead, Right Malar Cheek (2) Complexity: simple   Destruction method: cryotherapy   Informed consent: discussed and consent obtained   Timeout:  patient name, date of birth, surgical site, and procedure verified Lesion destroyed using liquid nitrogen: Yes   Post-procedure details: wound care instructions given    Folliculitis  Related Medications clindamycin (CLEOCIN-T) 1 % lotion Apply topically daily.  Return  in about 1 month for Wart F/U  Return in about 6 months (around 06/14/2023) for AK F/U.  Documentation: I  have reviewed the above documentation for accuracy and completeness, and I agree with the above.  Stasia Cavalier, am acting as scribe for Langston Reusing, DO.   Langston Reusing, DO

## 2022-12-12 NOTE — Patient Instructions (Addendum)
Cryotherapy Aftercare  Wash gently with soap and water everyday.   Apply Vaseline and Band-Aid daily until healed.   Due to recent changes in healthcare laws, you may see results of your pathology and/or laboratory studies on MyChart before the doctors have had a chance to review them. We understand that in some cases there may be results that are confusing or concerning to you. Please understand that not all results are received at the same time and often the doctors may need to interpret multiple results in order to provide you with the best plan of care or course of treatment. Therefore, we ask that you please give [place] 2 business days to thoroughly review all your results before contacting the office for clarification. Should we see a critical lab result, you will be contacted sooner.   If You Need Anything After Your Visit  If you have any questions or concerns for your doctor, please call our main line at [phone] If no one answers, please leave a voicemail as directed and we will return your call as soon as possible. Messages left after 4 pm will be answered the following business day.   You may also send [place] a message via [place]. We typically respond to MyChart messages within 1-2 business days.  For prescription refills, please ask your pharmacy to contact our office. Our fax number is [phone].  If you have an urgent issue when the clinic is closed that cannot wait until the next business day, you can page your doctor at the number below.    Please note that while we do our best to be available for urgent issues outside of office hours, we are not available 24/7.   If you have an urgent issue and are unable to reach [place], you may choose to seek medical care at your doctor's office, retail clinic, urgent care center, or emergency room.  If you have a medical emergency, please immediately call 911 or go to the emergency department. In the event of inclement weather, please call our  main line at [phone] for an update on the status of any delays or closures.  Dermatology Medication Tips: Please keep the boxes that topical medications come in in order to help keep track of the instructions about where and how to use these. Pharmacies typically print the medication instructions only on the boxes and not directly on the medication tubes.   If your medication is too expensive, please contact our office at [phone] or send [place] a message through [place].   We are unable to tell what your co-pay for medications will be in advance as this is different depending on your insurance coverage. However, we may be able to find a substitute medication at lower cost or fill out paperwork to get insurance to cover a needed medication.   If a prior authorization is required to get your medication covered by your insurance company, please allow [place] 1-2 business days to complete this process.  Drug prices often vary depending on where the prescription is filled and some pharmacies may offer cheaper prices.  The website www.goodrx.com contains coupons for medications through different pharmacies. The prices here do not account for what the cost may be with help from insurance (it may be cheaper with your insurance), but the website can give you the price if you did not use any insurance.  - You can print the associated coupon and take it with your prescription to the pharmacy.  - You may also  stop by our office during regular business hours and pick up a GoodRx coupon card.  - If you need your prescription sent electronically to a different pharmacy, notify our office through [facility] or by phone at [phone]

## 2022-12-19 ENCOUNTER — Encounter: Payer: Self-pay | Admitting: Dermatology

## 2023-01-17 ENCOUNTER — Ambulatory Visit (INDEPENDENT_AMBULATORY_CARE_PROVIDER_SITE_OTHER): Payer: 59 | Admitting: Dermatology

## 2023-01-17 DIAGNOSIS — B079 Viral wart, unspecified: Secondary | ICD-10-CM | POA: Diagnosis not present

## 2023-01-17 NOTE — Progress Notes (Signed)
   Follow-Up Visit   Subjective  Samuel Duran is a 52 y.o. male who presents for the following: Warts  Patient present today for follow up visit for warts. Patient was last evaluated on 12/12/22. Patient reports sxs are Better. Patient reports no medication changes. Patient had areas treated once.  The following portions of the chart were reviewed this encounter and updated as appropriate: medications, allergies, medical history  Review of Systems:  No other skin or systemic complaints except as noted in HPI or Assessment and Plan.  Objective  Well appearing patient in no apparent distress; mood and affect are within normal limits.  A focused examination was performed of the following areas: Left Lateral Canthus; Mid Parietal Scalp (2); Right Malar Cheek (2); Left Forehead; Right Forehead   Relevant exam findings are noted in the Assessment and Plan.    Assessment & Plan   WART Exam: verrucous papule(s) 2 on forehead one on the right   Counseling Discussed viral / HPV (Human Papilloma Virus) etiology and risk of spread /infectivity to other areas of body as well as to other people.  Multiple treatments and methods may be required to clear warts and it is possible treatment may not be successful.  Treatment risks include discoloration; scarring and there is still potential for wart recurrence.  Treatment Plan: - We recommended starting Cimetidine tablet for 2 months as maintaince to prevent additional flares - We will plan to follow up in 6 weeks   Viral warts, unspecified type (3) Left Medial Canthus; Left Malar Cheek; Right Forehead  Destruction of lesion - Left Malar Cheek, Left Medial Canthus, Right Forehead Complexity: simple   Destruction method: cryotherapy   Informed consent: discussed and consent obtained   Timeout:  patient name, date of birth, surgical site, and procedure verified Lesion destroyed using liquid nitrogen: Yes   Cryotherapy cycles:   1 Post-procedure details: wound care instructions given      Return in about 6 weeks (around 02/28/2023) for Warts F/U.  Documentation: I have reviewed the above documentation for accuracy and completeness, and I agree with the above.  Stasia Cavalier, am acting as scribe for Langston Reusing, DO.  Langston Reusing, DO

## 2023-01-17 NOTE — Patient Instructions (Addendum)
Cryotherapy Aftercare  Wash gently with soap and water everyday.   Apply Vaseline and Band-Aid daily until healed.     Important Information  Due to recent changes in healthcare laws, you may see results of your pathology and/or laboratory studies on MyChart before the doctors have had a chance to review them. We understand that in some cases there may be results that are confusing or concerning to you. Please understand that not all results are received at the same time and often the doctors may need to interpret multiple results in order to provide you with the best plan of care or course of treatment. Therefore, we ask that you please give [place] 2 business days to thoroughly review all your results before contacting the office for clarification. Should we see a critical lab result, you will be contacted sooner.   If You Need Anything After Your Visit  If you have any questions or concerns for your doctor, please call our main line at [phone] If no one answers, please leave a voicemail as directed and we will return your call as soon as possible. Messages left after 4 pm will be answered the following business day.   You may also send [place] a message via MyChart. We typically respond to MyChart messages within 1-2 business days.  For prescription refills, please ask your pharmacy to contact our office. Our fax number is [phone].  If you have an urgent issue when the clinic is closed that cannot wait until the next business day, you can page your doctor at the number below.    Please note that while we do our best to be available for urgent issues outside of office hours, we are not available 24/7.   If you have an urgent issue and are unable to reach [place], you may choose to seek medical care at your doctor's office, retail clinic, urgent care center, or emergency room.  If you have a medical emergency, please immediately call 911 or go to the emergency department. In the event of  inclement weather, please call our main line at [phone] for an update on the status of any delays or closures.  Dermatology Medication Tips: Please keep the boxes that topical medications come in in order to help keep track of the instructions about where and how to use these. Pharmacies typically print the medication instructions only on the boxes and not directly on the medication tubes.   If your medication is too expensive, please contact our office at [phone] or send [place] a message through MyChart.   We are unable to tell what your co-pay for medications will be in advance as this is different depending on your insurance coverage. However, we may be able to find a substitute medication at lower cost or fill out paperwork to get insurance to cover a needed medication.   If a prior authorization is required to get your medication covered by your insurance company, please allow [place] 1-2 business days to complete this process.  Drug prices often vary depending on where the prescription is filled and some pharmacies may offer cheaper prices.  The website www.goodrx.com contains coupons for medications through different pharmacies. The prices here do not account for what the cost may be with help from insurance (it may be cheaper with your insurance), but the website can give you the price if you did not use any insurance.  - You can print the associated coupon and take it with your prescription to the pharmacy.  -  You may also stop by our office during regular business hours and pick up a GoodRx coupon card.  - If you need your prescription sent electronically to a different pharmacy, notify our office through [facility] or by phone at [phone]

## 2023-02-28 ENCOUNTER — Encounter: Payer: Self-pay | Admitting: Dermatology

## 2023-02-28 ENCOUNTER — Ambulatory Visit: Payer: 59 | Admitting: Dermatology

## 2023-02-28 VITALS — BP 150/93 | HR 64

## 2023-02-28 DIAGNOSIS — B079 Viral wart, unspecified: Secondary | ICD-10-CM | POA: Diagnosis not present

## 2023-02-28 NOTE — Patient Instructions (Addendum)

## 2023-02-28 NOTE — Progress Notes (Unsigned)
   Follow-Up Visit   Subjective  Samuel Duran is a 52 y.o. male who presents for the following: Warts  Patient present today for follow up visit for Wart. Patient was last evaluated on 01/17/23. Patient reports sxs are better. Patient denies medication changes. Patient reports he is currently taking Cimetidine daily and feels the areas have improved.   The following portions of the chart were reviewed this encounter and updated as appropriate: medications, allergies, medical history  Review of Systems:  No other skin or systemic complaints except as noted in HPI or Assessment and Plan.  Objective  Well appearing patient in no apparent distress; mood and affect are within normal limits.  A focused examination was performed of the following areas: Face  Relevant exam findings are noted in the Assessment and Plan.    Assessment & Plan   WART Exam: verrucous papule(s)  Counseling Discussed viral / HPV (Human Papilloma Virus) etiology and risk of spread /infectivity to other areas of body as well as to other people.  Multiple treatments and methods may be required to clear warts and it is possible treatment may not be successful.  Treatment risks include discoloration; scarring and there is still potential for wart recurrence.  Treatment Plan: - Recommended continuing the Cimetidine for 4 additional weeks - We will plan to follow up PRN  Return if symptoms worsen or fail to improve, for Warts F/U.    Documentation: I have reviewed the above documentation for accuracy and completeness, and I agree with the above.  Stasia Cavalier, am acting as scribe for Langston Reusing, DO.   Langston Reusing, DO

## 2023-06-14 ENCOUNTER — Ambulatory Visit: Payer: 59 | Admitting: Dermatology

## 2024-03-26 ENCOUNTER — Telehealth: Admitting: Physician Assistant

## 2024-03-26 DIAGNOSIS — B9689 Other specified bacterial agents as the cause of diseases classified elsewhere: Secondary | ICD-10-CM

## 2024-03-26 DIAGNOSIS — J019 Acute sinusitis, unspecified: Secondary | ICD-10-CM

## 2024-03-26 MED ORDER — AMOXICILLIN-POT CLAVULANATE 875-125 MG PO TABS: 1.0000 | ORAL_TABLET | Freq: Two times a day (BID) | ORAL | 0 refills | Status: AC

## 2024-03-26 MED ORDER — PREDNISONE 20 MG PO TABS: 40.0000 mg | ORAL_TABLET | Freq: Every day | ORAL | 0 refills | Status: AC

## 2024-03-26 NOTE — Progress Notes (Signed)

## 2024-03-26 NOTE — Addendum Note (Signed)
 Addended by: VIVIENNE DELON HERO on: 03/26/2024 10:42 AM   Modules accepted: Orders
